# Patient Record
Sex: Female | Born: 1980 | Race: White | Hispanic: No | Marital: Married | Smoking: Never smoker
Health system: Southern US, Community
[De-identification: ages and names within clinical notes are randomized; demographics above are authoritative.]

## PROBLEM LIST (undated history)

## (undated) DIAGNOSIS — E669 Obesity, unspecified: Secondary | ICD-10-CM

## (undated) DIAGNOSIS — K219 Gastro-esophageal reflux disease without esophagitis: Secondary | ICD-10-CM

## (undated) DIAGNOSIS — R42 Dizziness and giddiness: Secondary | ICD-10-CM

---

## 2004-04-18 ENCOUNTER — Emergency Department: Payer: Self-pay | Admitting: Emergency Medicine

## 2004-05-13 ENCOUNTER — Emergency Department: Payer: Self-pay | Admitting: Emergency Medicine

## 2004-06-09 ENCOUNTER — Emergency Department: Payer: Self-pay | Admitting: Emergency Medicine

## 2005-02-19 ENCOUNTER — Emergency Department: Payer: Self-pay | Admitting: General Practice

## 2006-01-21 ENCOUNTER — Observation Stay: Payer: Self-pay | Admitting: Obstetrics & Gynecology

## 2006-02-20 ENCOUNTER — Observation Stay: Payer: Self-pay | Admitting: Obstetrics & Gynecology

## 2006-02-24 ENCOUNTER — Observation Stay: Payer: Self-pay

## 2006-03-11 ENCOUNTER — Observation Stay: Payer: Self-pay | Admitting: Obstetrics & Gynecology

## 2006-03-14 ENCOUNTER — Observation Stay: Payer: Self-pay

## 2006-03-20 ENCOUNTER — Inpatient Hospital Stay: Payer: Self-pay

## 2006-03-20 ENCOUNTER — Observation Stay: Payer: Self-pay

## 2009-08-31 ENCOUNTER — Emergency Department: Payer: Self-pay | Admitting: Emergency Medicine

## 2009-09-18 ENCOUNTER — Emergency Department: Payer: Self-pay | Admitting: Emergency Medicine

## 2010-01-16 ENCOUNTER — Emergency Department: Payer: Self-pay | Admitting: Emergency Medicine

## 2010-10-13 ENCOUNTER — Emergency Department: Payer: Self-pay | Admitting: Emergency Medicine

## 2010-12-10 ENCOUNTER — Emergency Department (HOSPITAL_COMMUNITY)
Admission: EM | Admit: 2010-12-10 | Discharge: 2010-12-10 | Disposition: A | Discharge status: Home / Self Care | Payer: Self-pay | Attending: Emergency Medicine | Admitting: Emergency Medicine

## 2010-12-10 DIAGNOSIS — R1013 Epigastric pain: Secondary | ICD-10-CM | POA: Insufficient documentation

## 2010-12-10 DIAGNOSIS — K219 Gastro-esophageal reflux disease without esophagitis: Secondary | ICD-10-CM | POA: Insufficient documentation

## 2010-12-10 LAB — URINALYSIS, ROUTINE W REFLEX MICROSCOPIC
Glucose, UA: NEGATIVE mg/dL
Hgb urine dipstick: NEGATIVE
Ketones, ur: 15 mg/dL — AB
Protein, ur: NEGATIVE mg/dL

## 2010-12-10 LAB — CBC
HCT: 37.2 % (ref 36.0–46.0)
Hemoglobin: 12.7 g/dL (ref 12.0–15.0)
MCH: 29.3 pg (ref 26.0–34.0)
MCHC: 34.1 g/dL (ref 30.0–36.0)

## 2010-12-10 LAB — DIFFERENTIAL
Lymphocytes Relative: 34 % (ref 12–46)
Monocytes Absolute: 1.2 10*3/uL — ABNORMAL HIGH (ref 0.1–1.0)
Monocytes Relative: 12 % (ref 3–12)
Neutro Abs: 5.1 10*3/uL (ref 1.7–7.7)

## 2010-12-10 LAB — COMPREHENSIVE METABOLIC PANEL
AST: 30 U/L (ref 0–37)
Albumin: 3.9 g/dL (ref 3.5–5.2)
Chloride: 106 mEq/L (ref 96–112)
Creatinine, Ser: 0.63 mg/dL (ref 0.50–1.10)
Potassium: 3.4 mEq/L — ABNORMAL LOW (ref 3.5–5.1)
Total Bilirubin: 0.4 mg/dL (ref 0.3–1.2)
Total Protein: 7.6 g/dL (ref 6.0–8.3)

## 2010-12-10 LAB — URINE MICROSCOPIC-ADD ON

## 2010-12-11 LAB — URINE CULTURE
Colony Count: 50000
Culture  Setup Time: 201209051101

## 2011-04-05 ENCOUNTER — Emergency Department (HOSPITAL_COMMUNITY)
Admission: EM | Admit: 2011-04-05 | Discharge: 2011-04-05 | Disposition: A | Discharge status: Home / Self Care | Payer: Self-pay | Attending: Emergency Medicine | Admitting: Emergency Medicine

## 2011-04-05 DIAGNOSIS — K219 Gastro-esophageal reflux disease without esophagitis: Secondary | ICD-10-CM | POA: Insufficient documentation

## 2011-04-05 DIAGNOSIS — R1110 Vomiting, unspecified: Secondary | ICD-10-CM

## 2011-04-05 DIAGNOSIS — R112 Nausea with vomiting, unspecified: Secondary | ICD-10-CM | POA: Insufficient documentation

## 2011-04-05 HISTORY — DX: Dizziness and giddiness: R42

## 2011-04-05 HISTORY — DX: Gastro-esophageal reflux disease without esophagitis: K21.9

## 2011-04-05 LAB — URINALYSIS, ROUTINE W REFLEX MICROSCOPIC
Bilirubin Urine: NEGATIVE
Hgb urine dipstick: NEGATIVE
Ketones, ur: NEGATIVE mg/dL
Protein, ur: NEGATIVE mg/dL
Urobilinogen, UA: 0.2 mg/dL (ref 0.0–1.0)

## 2011-04-05 LAB — URINE MICROSCOPIC-ADD ON

## 2011-04-05 MED ORDER — ONDANSETRON 4 MG PO TBDP
8.0000 mg | ORAL_TABLET | Freq: Once | ORAL | Status: AC
  Administered 2011-04-05: 8 mg via ORAL
  Filled 2011-04-05: qty 2

## 2011-04-05 MED ORDER — ONDANSETRON 8 MG PO TBDP: 8.0000 mg | ORAL_TABLET | Freq: Three times a day (TID) | ORAL | Status: AC | PRN

## 2011-04-05 NOTE — ED Provider Notes (Signed)
History     CSN: [id number]  Arrival date & time 04/05/11  1703   First MD Initiated Contact with Patient 04/05/11 2001      Chief Complaint  Patient presents with  . Nausea  . Emesis    (Consider location/radiation/quality/duration/timing/severity/associated sxs/prior treatment) HPI Comments: Patient with seven-day history of multiple episodes of vomiting per day. She says that the vomiting is worse when she eats. She denies any abdominal pain, fever, change in bowel habits. She denies any urinary symptoms. She has not treated herself with any medications. She has not had a history of this. Patient has a history of gastric reflux. She denies any change in these symptoms.  Patient is a 30 y.o. female presenting with vomiting. The history is provided by the patient.  Emesis  This is a new problem. The current episode started more than 1 week ago. The problem occurs 2 to 4 times per day. The emesis has an appearance of stomach contents. There has been no fever. Pertinent negatives include no abdominal pain, no arthralgias, no chills, no cough, no diarrhea, no fever, no headaches, no myalgias and no URI.    Past Medical History  Diagnosis Date  . Vertigo   . Acid reflux     History reviewed. No pertinent past surgical history.  No family history on file.  History  Substance Use Topics  . Smoking status: Never Smoker   . Smokeless tobacco: Not on file  . Alcohol Use: Yes     occasional    OB History    Grav Para Term Preterm Abortions TAB SAB Ect Mult Living                  Review of Systems  Constitutional: Negative for fever and chills.  HENT: Negative for sore throat and rhinorrhea.   Eyes: Negative for discharge.  Respiratory: Negative for cough and shortness of breath.   Cardiovascular: Negative for chest pain.  Gastrointestinal: Positive for nausea and vomiting. Negative for abdominal pain, diarrhea and constipation.  Genitourinary: Negative for dysuria and  hematuria.  Musculoskeletal: Negative for myalgias and arthralgias.  Skin: Negative for rash.  Neurological: Negative for headaches.  Psychiatric/Behavioral: Negative for confusion.    Allergies  Review of patient's allergies indicates no known allergies.  Home Medications  No current outpatient prescriptions on file.  BP 132/72  Pulse 84  Temp(Src) 98.1 F (36.7 C) (Oral)  Resp 18  SpO2 100%  LMP 03/15/2011  Physical Exam  Nursing note and vitals reviewed. Constitutional: She is oriented to person, place, and time. She appears well-developed and well-nourished.       Obese  HENT:  Head: Normocephalic and atraumatic.  Eyes: Right eye exhibits no discharge. Left eye exhibits no discharge.  Neck: Normal range of motion. Neck supple.  Cardiovascular: Normal rate and regular rhythm.  Exam reveals no gallop and no friction rub.   No murmur heard. Pulmonary/Chest: Effort normal and breath sounds normal. No respiratory distress. She has no wheezes.  Abdominal: Soft. Bowel sounds are normal. She exhibits no distension. There is no tenderness. There is no rebound and no guarding.  Musculoskeletal: Normal range of motion.  Neurological: She is alert and oriented to person, place, and time.  Skin: Skin is warm and dry. No rash noted.  Psychiatric: She has a normal mood and affect.    ED Course  Procedures (including critical care time)  Labs Reviewed  URINALYSIS, ROUTINE W REFLEX MICROSCOPIC - Abnormal; Notable for the  following:    APPearance CLOUDY (*)    Leukocytes, UA MODERATE (*)    All other components within normal limits  URINE MICROSCOPIC-ADD ON - Abnormal; Notable for the following:    Squamous Epithelial / LPF MANY (*)    Bacteria, UA MANY (*)    All other components within normal limits  POCT PREGNANCY, URINE  URINE CULTURE   No results found.   1. Vomiting     8:42 PM patient seen and examined. Will give Zofran and check urine tests. Will give fluid  trial.  8:57 PM patient informed of results. Urine culture sent. She is feeling better after Zofran. Will discharge to home. Patient urged to return with worsening pain, fever, persistent vomiting, or if she has any other concerns. GI referral given. Patient urged to followup if no improvement in one week. She verbalizes understanding and agrees with plan.  MDM  Patient with vomiting without fever or abdominal pain. Urine tests and pregnancy test are negative. Do not feel lab work would be helpful at this point given lack of other symptoms. Patient is improved with Zofran. She is not clinically dehydrated. She otherwise appears well and is stable for discharge home.    Medical screening examination/treatment/procedure(s) were performed by non-physician practitioner and as supervising physician I was immediately available for consultation/collaboration. Osvaldo Human, M.D.   Eustace Moore [place], [place] 04/06/11 1610  Carleene Cooper III, MD 04/06/11 [phone]

## 2011-04-05 NOTE — ED Notes (Signed)
Pt states she is sexually active, but is not using any birth control.

## 2011-04-05 NOTE — ED Notes (Signed)
Pt reports that on 12/24 she began having some nausea and she vomited once.  Since then she is vomiting 2 or 3 times per day.

## 2011-04-05 NOTE — ED Notes (Signed)
Patient given ginger ale, per physician's orders.  Patient tolerating well.  Denies nausea at this time.

## 2011-04-05 NOTE — ED Notes (Signed)
PA at bedside.

## 2011-04-05 NOTE — ED Notes (Signed)
Assisted patient to restroom to collect urine specimen.

## 2011-04-05 NOTE — ED Notes (Addendum)
Patient complaining of headache, sleepiness, nausea and vomiting since the 24th of December; patient states that she is able to eat and drink, but vomits an hour or two after ingesting.  Patient denies recent cold symptoms, chest pain, abdominal pain, shortness of breath, blurred vision, and numbness/tingling in hands/feet.  Denies any urinary changes, reports thick white discharge on two occassions this week.  Upon assessment, bowel sounds are present in all four quadrants; abdomen is soft and non-tender to the touch.  Patient alert and oriented x4; PERRL present.  Will continue to monitor.

## 2011-04-06 LAB — URINE CULTURE: Culture  Setup Time: 201212310138

## 2011-06-02 ENCOUNTER — Emergency Department (HOSPITAL_COMMUNITY): Payer: Medicaid Other

## 2011-06-02 ENCOUNTER — Encounter (HOSPITAL_COMMUNITY): Payer: Self-pay | Admitting: *Deleted

## 2011-06-02 ENCOUNTER — Emergency Department (HOSPITAL_COMMUNITY)
Admission: EM | Admit: 2011-06-02 | Discharge: 2011-06-02 | Disposition: A | Discharge status: Home / Self Care | Payer: Medicaid Other | Attending: Emergency Medicine | Admitting: Emergency Medicine

## 2011-06-02 DIAGNOSIS — R1012 Left upper quadrant pain: Secondary | ICD-10-CM | POA: Insufficient documentation

## 2011-06-02 DIAGNOSIS — K219 Gastro-esophageal reflux disease without esophagitis: Secondary | ICD-10-CM | POA: Insufficient documentation

## 2011-06-02 DIAGNOSIS — N390 Urinary tract infection, site not specified: Secondary | ICD-10-CM | POA: Insufficient documentation

## 2011-06-02 LAB — URINALYSIS, ROUTINE W REFLEX MICROSCOPIC
Bilirubin Urine: NEGATIVE
Glucose, UA: NEGATIVE mg/dL
Hgb urine dipstick: NEGATIVE
Specific Gravity, Urine: 1.013 (ref 1.005–1.030)
Urobilinogen, UA: 0.2 mg/dL (ref 0.0–1.0)
pH: 7 (ref 5.0–8.0)

## 2011-06-02 LAB — CBC
HCT: 38 % (ref 36.0–46.0)
Hemoglobin: 12.7 g/dL (ref 12.0–15.0)
MCHC: 33.4 g/dL (ref 30.0–36.0)
MCV: 88 fL (ref 78.0–100.0)
RDW: 13.5 % (ref 11.5–15.5)

## 2011-06-02 LAB — COMPREHENSIVE METABOLIC PANEL
Albumin: 3.5 g/dL (ref 3.5–5.2)
Alkaline Phosphatase: 64 U/L (ref 39–117)
BUN: 7 mg/dL (ref 6–23)
Chloride: 105 mEq/L (ref 96–112)
Creatinine, Ser: 0.55 mg/dL (ref 0.50–1.10)
GFR calc Af Amer: >90 mL/min (ref >90)
Glucose, Bld: 93 mg/dL (ref 70–99)
Potassium: 3.9 mEq/L (ref 3.5–5.1)
Total Bilirubin: 0.5 mg/dL (ref 0.3–1.2)
Total Protein: 7.2 g/dL (ref 6.0–8.3)

## 2011-06-02 LAB — DIFFERENTIAL
Basophils Absolute: 0 10*3/uL (ref 0.0–0.1)
Basophils Relative: 0 % (ref 0–1)
Eosinophils Relative: 1 % (ref 0–5)
Monocytes Absolute: 0.8 10*3/uL (ref 0.1–1.0)
Monocytes Relative: 10 % (ref 3–12)

## 2011-06-02 LAB — URINE MICROSCOPIC-ADD ON

## 2011-06-02 LAB — POCT PREGNANCY, URINE: Preg Test, Ur: POSITIVE — AB

## 2011-06-02 MED ORDER — CEPHALEXIN 500 MG PO CAPS: 500.0000 mg | ORAL_CAPSULE | Freq: Four times a day (QID) | ORAL | Status: AC

## 2011-06-02 MED ORDER — FAMOTIDINE 20 MG PO TABS
40.0000 mg | ORAL_TABLET | Freq: Once | ORAL | Status: AC
  Administered 2011-06-02: 40 mg via ORAL

## 2011-06-02 MED ORDER — ONDANSETRON HCL 4 MG PO TABS: 4.0000 mg | ORAL_TABLET | Freq: Four times a day (QID) | ORAL | Status: AC | PRN

## 2011-06-02 MED ORDER — FAMOTIDINE 20 MG PO TABS
ORAL_TABLET | ORAL | Status: AC
  Filled 2011-06-02: qty 2

## 2011-06-02 MED ORDER — SODIUM CHLORIDE 0.9 % IV SOLN: INTRAVENOUS | Status: DC

## 2011-06-02 NOTE — ED Notes (Signed)
Patient complains of left sided upper abd pain and tightness in her abd.  She also reports dizziness when she stands up.  She is also reporting nausea.  She is [redacted] weeks pregnant.

## 2011-06-02 NOTE — ED Provider Notes (Deleted)
30yo F, c/o gradual onset and worsening of persistent upper abd pain for the past week, constant since yesterday.  Describes the pain as "tightness," occurs with eating, assoc with nausea. ?hx gallstones.  LMP 04/13/2011, EGA approx 7-1/7 weeks.  Denies lower abd pain, no vaginal bleeding/discharge.  Labs, [place], UA/UC, orthostatic VS, IVF ordered.   I saw this patient for screening purposes.  Pt is stable, but further workup and evaluation is needed beyond triage capabilities.   Laray Anger, DO 06/02/11 1210

## 2011-06-02 NOTE — ED Provider Notes (Signed)
History     CSN: [id number]  Arrival date & time 06/02/11  1040   First MD Initiated Contact with Patient 06/02/11 1332      Chief Complaint  Patient presents with  . Abdominal Pain    HPI Pt was seen at 1155.  Per pt, c/o gradual onset and worsening of persistent left and mid-upper abd pain for the past week, constant since yesterday.  Describes the pain as "tightness," occurs with eating, assoc with nausea. ?hx gallstones.  LMP 04/13/2011, EGA approx 7-1/7 weeks.  Denies lower abd pain, no vaginal bleeding/discharge, no back pain, no vomiting/diarrhea, no fevers, no CP/SOB.   Past Medical History  Diagnosis Date  . Vertigo   . Acid reflux     History reviewed. No pertinent past surgical history.   History  Substance Use Topics  . Smoking status: Never Smoker   . Smokeless tobacco: Not on file  . Alcohol Use: No     occasional    Review of Systems ROS: Statement: All systems negative except as marked or noted in the HPI; Constitutional: Negative for fever and chills. ; ; Eyes: Negative for eye pain, redness and discharge. ; ; ENMT: Negative for ear pain, hoarseness, nasal congestion, sinus pressure and sore throat. ; ; Cardiovascular: Negative for chest pain, palpitations, diaphoresis, dyspnea and peripheral edema. ; ; Respiratory: Negative for cough, wheezing and stridor. ; ; Gastrointestinal: +nausea, abd pain.  Negative for vomiting, diarrhea, blood in stool, hematemesis, jaundice and rectal bleeding. . ; ; Genitourinary: Negative for dysuria, flank pain and hematuria. ; ; Musculoskeletal: Negative for back pain and neck pain. Negative for swelling and trauma.; ; Skin: Negative for pruritus, rash, abrasions, blisters, bruising and skin lesion.; ; Neuro: Negative for headache, lightheadedness and neck stiffness. Negative for weakness, altered level of consciousness , altered mental status, extremity weakness, paresthesias, involuntary movement, seizure and syncope.      Allergies  Review of patient's allergies indicates no known allergies.  Home Medications   Current Outpatient Rx  Name Route Sig Dispense Refill  . PRENATAL MULTIVITAMIN CH Oral Take 1 tablet by mouth daily.      BP 117/74  Pulse 80  Temp 98 F (36.7 C)  Resp 18  Ht 5\' 5"  (1.651 m)  Wt 312 lb (141.522 kg)  BMI 51.92 kg/m2  SpO2 98%  Physical Exam 1200: Physical examination:  Nursing notes reviewed; Vital signs and O2 SAT reviewed;  Constitutional: Well developed, Well nourished, Well hydrated, In no acute distress; Head:  Normocephalic, atraumatic; Eyes: EOMI, PERRL, No scleral icterus; ENMT: Mouth and pharynx normal, Mucous membranes moist; Neck: Supple, Full range of motion, No lymphadenopathy; Cardiovascular: Regular rate and rhythm, No murmur, rub, or gallop; Respiratory: Breath sounds clear & equal bilaterally, No rales, rhonchi, wheezes, or rub, Normal respiratory effort/excursion; Chest: Nontender, Movement normal; Abdomen: Soft, +mild LUQ and mid-epigastric TTP, no rebound or guarding.  Nondistended, Normal bowel sounds; Genitourinary: No CVA tenderness; Extremities: Pulses normal, No tenderness, No edema, No calf edema or asymmetry.; Neuro: AA&Ox3, Major CN grossly intact.  No gross focal motor or sensory deficits in extremities.; Skin: Color normal, Warm, Dry.    ED Course  Procedures    MDM  MDM Reviewed: nursing note and vitals Interpretation: labs and ultrasound   Results for orders placed during the hospital encounter of 06/02/11  CBC      Component Value Range   WBC 8.2  4.0 - 10.5 (K/uL)   RBC 4.32  3.87 -  5.11 (MIL/uL)   Hemoglobin 12.7  12.0 - 15.0 (g/dL)   HCT 16.1  09.6 - 04.5 (%)   MCV 88.0  78.0 - 100.0 (fL)   MCH 29.4  26.0 - 34.0 (pg)   MCHC 33.4  30.0 - 36.0 (g/dL)   RDW 40.9  81.1 - 91.4 (%)   Platelets 197  150 - 400 (K/uL)  DIFFERENTIAL      Component Value Range   Neutrophils Relative 58  43 - 77 (%)   Neutro Abs 4.8  1.7 - 7.7  (K/uL)   Lymphocytes Relative 30  12 - 46 (%)   Lymphs Abs 2.5  0.7 - 4.0 (K/uL)   Monocytes Relative 10  3 - 12 (%)   Monocytes Absolute 0.8  0.1 - 1.0 (K/uL)   Eosinophils Relative 1  0 - 5 (%)   Eosinophils Absolute 0.1  0.0 - 0.7 (K/uL)   Basophils Relative 0  0 - 1 (%)   Basophils Absolute 0.0  0.0 - 0.1 (K/uL)  LIPASE, BLOOD      Component Value Range   Lipase 14  11 - 59 (U/L)  COMPREHENSIVE METABOLIC PANEL      Component Value Range   Sodium 137  135 - 145 (mEq/L)   Potassium 3.9  3.5 - 5.1 (mEq/L)   Chloride 105  96 - 112 (mEq/L)   CO2 22  19 - 32 (mEq/L)   Glucose, Bld 93  70 - 99 (mg/dL)   BUN 7  6 - 23 (mg/dL)   Creatinine, Ser 7.82  0.50 - 1.10 (mg/dL)   Calcium 9.4  8.4 - 95.6 (mg/dL)   Total Protein 7.2  6.0 - 8.3 (g/dL)   Albumin 3.5  3.5 - 5.2 (g/dL)   AST 18  0 - 37 (U/L)   ALT 15  0 - 35 (U/L)   Alkaline Phosphatase 64  39 - 117 (U/L)   Total Bilirubin 0.5  0.3 - 1.2 (mg/dL)   GFR calc non Af Amer >90  >90 (mL/min)   GFR calc Af Amer >90  >90 (mL/min)  URINALYSIS, ROUTINE W REFLEX MICROSCOPIC      Component Value Range   Color, Urine YELLOW  YELLOW    APPearance CLOUDY (*) CLEAR    Specific Gravity, Urine 1.013  1.005 - 1.030    pH 7.0  5.0 - 8.0    Glucose, UA NEGATIVE  NEGATIVE (mg/dL)   Hgb urine dipstick NEGATIVE  NEGATIVE    Bilirubin Urine NEGATIVE  NEGATIVE    Ketones, ur NEGATIVE  NEGATIVE (mg/dL)   Protein, ur NEGATIVE  NEGATIVE (mg/dL)   Urobilinogen, UA 0.2  0.0 - 1.0 (mg/dL)   Nitrite NEGATIVE  NEGATIVE    Leukocytes, UA MODERATE (*) NEGATIVE   POCT PREGNANCY, URINE      Component Value Range   Preg Test, Ur POSITIVE (*) NEGATIVE   URINE MICROSCOPIC-ADD ON      Component Value Range   Squamous Epithelial / LPF FEW (*) RARE    WBC, UA 7-10  <3 (WBC/hpf)   RBC / HPF 0-2  <3 (RBC/hpf)   Bacteria, UA RARE  RARE     US Abdomen Complete 06/02/2011  *RADIOLOGY REPORT*  Clinical Data:  Upper abdominal pain.  ABDOMINAL ULTRASOUND COMPLETE   Comparison:  None.  Findings:  Gallbladder:  No gallstones, gallbladder wall thickening, or pericholecystic fluid.  Common Bile Duct:  Within normal limits in caliber. Measures 3 mm in diameter.  Liver: No  focal mass lesion identified.  Within normal limits in parenchymal echogenicity.  IVC:  Appears normal.  Pancreas:  No abnormality identified.  Spleen:  Within normal limits in size and echotexture.  Right kidney:  Normal in size and parenchymal echogenicity.  No evidence of mass or hydronephrosis.  Left kidney:  Normal in size and parenchymal echogenicity.  No evidence of mass or hydronephrosis.  Abdominal Aorta:  No aneurysm identified.  IMPRESSION: Negative abdominal ultrasound.  Original Report Authenticated By: Danae Orleans, M.D.    2:01 PM:  Pt not orthostatic, VS remain stable.  No N/V while in ED.  [place] abd neg for acute GB, LFT's and lipase normal.  +UTI, UC pending, will tx for same.  Denies lower abd/pelvic pain, cramping, vaginal discharge or bleeding.  Endorses she has an appt next week with OB/GYN for this pregnancy.  Dx testing d/w pt and family.  Questions answered.  Verb understanding, agreeable to d/c home with outpt f/u.           Laray Anger, DO 06/04/11 [phone]

## 2011-06-03 LAB — URINE CULTURE

## 2011-06-15 ENCOUNTER — Other Ambulatory Visit: Payer: Self-pay

## 2011-06-15 ENCOUNTER — Other Ambulatory Visit (HOSPITAL_COMMUNITY): Payer: Self-pay | Admitting: Nurse Practitioner

## 2011-06-15 DIAGNOSIS — Z3682 Encounter for antenatal screening for nuchal translucency: Secondary | ICD-10-CM

## 2011-07-14 ENCOUNTER — Encounter (HOSPITAL_COMMUNITY): Payer: Self-pay

## 2011-07-14 ENCOUNTER — Ambulatory Visit (HOSPITAL_COMMUNITY)
Admission: RE | Admit: 2011-07-14 | Discharge: 2011-07-14 | Disposition: A | Discharge status: Home / Self Care | Payer: Medicaid Other | Source: Ambulatory Visit | Attending: Nurse Practitioner | Admitting: Nurse Practitioner

## 2011-07-14 ENCOUNTER — Ambulatory Visit (HOSPITAL_COMMUNITY): Payer: Medicaid Other

## 2011-07-14 DIAGNOSIS — O351XX0 Maternal care for (suspected) chromosomal abnormality in fetus, not applicable or unspecified: Secondary | ICD-10-CM | POA: Insufficient documentation

## 2011-07-14 DIAGNOSIS — O3510X0 Maternal care for (suspected) chromosomal abnormality in fetus, unspecified, not applicable or unspecified: Secondary | ICD-10-CM | POA: Insufficient documentation

## 2011-07-14 DIAGNOSIS — O98519 Other viral diseases complicating pregnancy, unspecified trimester: Secondary | ICD-10-CM | POA: Insufficient documentation

## 2011-07-14 DIAGNOSIS — O352XX0 Maternal care for (suspected) hereditary disease in fetus, not applicable or unspecified: Secondary | ICD-10-CM | POA: Insufficient documentation

## 2011-07-14 DIAGNOSIS — O3410 Maternal care for benign tumor of corpus uteri, unspecified trimester: Secondary | ICD-10-CM | POA: Insufficient documentation

## 2011-07-14 DIAGNOSIS — Z3689 Encounter for other specified antenatal screening: Secondary | ICD-10-CM | POA: Insufficient documentation

## 2011-07-14 DIAGNOSIS — O337XX0 Maternal care for disproportion due to other fetal deformities, not applicable or unspecified: Secondary | ICD-10-CM | POA: Insufficient documentation

## 2011-07-14 DIAGNOSIS — O355XX0 Maternal care for (suspected) damage to fetus by drugs, not applicable or unspecified: Secondary | ICD-10-CM | POA: Insufficient documentation

## 2011-07-14 DIAGNOSIS — Z3682 Encounter for antenatal screening for nuchal translucency: Secondary | ICD-10-CM

## 2011-07-14 DIAGNOSIS — E669 Obesity, unspecified: Secondary | ICD-10-CM | POA: Insufficient documentation

## 2011-07-24 ENCOUNTER — Other Ambulatory Visit: Payer: Self-pay | Admitting: Maternal and Fetal Medicine

## 2011-08-10 ENCOUNTER — Other Ambulatory Visit (HOSPITAL_COMMUNITY): Payer: Self-pay | Admitting: Physician Assistant

## 2011-08-10 DIAGNOSIS — Z3689 Encounter for other specified antenatal screening: Secondary | ICD-10-CM

## 2011-08-24 ENCOUNTER — Ambulatory Visit (HOSPITAL_COMMUNITY)
Admission: RE | Admit: 2011-08-24 | Discharge: 2011-08-24 | Disposition: A | Discharge status: Home / Self Care | Payer: Medicaid Other | Source: Ambulatory Visit | Attending: Physician Assistant | Admitting: Physician Assistant

## 2011-08-24 DIAGNOSIS — Z3689 Encounter for other specified antenatal screening: Secondary | ICD-10-CM | POA: Insufficient documentation

## 2011-09-07 ENCOUNTER — Other Ambulatory Visit (HOSPITAL_COMMUNITY): Payer: Self-pay | Admitting: Family

## 2011-09-07 DIAGNOSIS — Z361 Encounter for antenatal screening for raised alphafetoprotein level: Secondary | ICD-10-CM

## 2011-09-16 ENCOUNTER — Ambulatory Visit (HOSPITAL_COMMUNITY)
Admission: RE | Admit: 2011-09-16 | Discharge: 2011-09-16 | Disposition: A | Discharge status: Home / Self Care | Payer: Medicaid Other | Source: Ambulatory Visit | Attending: Family | Admitting: Family

## 2011-09-16 DIAGNOSIS — O337XX0 Maternal care for disproportion due to other fetal deformities, not applicable or unspecified: Secondary | ICD-10-CM | POA: Insufficient documentation

## 2011-09-16 DIAGNOSIS — O98519 Other viral diseases complicating pregnancy, unspecified trimester: Secondary | ICD-10-CM | POA: Insufficient documentation

## 2011-09-16 DIAGNOSIS — A6000 Herpesviral infection of urogenital system, unspecified: Secondary | ICD-10-CM | POA: Insufficient documentation

## 2011-09-16 DIAGNOSIS — Z361 Encounter for antenatal screening for raised alphafetoprotein level: Secondary | ICD-10-CM

## 2011-09-16 DIAGNOSIS — O355XX0 Maternal care for (suspected) damage to fetus by drugs, not applicable or unspecified: Secondary | ICD-10-CM | POA: Insufficient documentation

## 2011-09-16 DIAGNOSIS — E669 Obesity, unspecified: Secondary | ICD-10-CM | POA: Insufficient documentation

## 2011-09-16 DIAGNOSIS — O352XX0 Maternal care for (suspected) hereditary disease in fetus, not applicable or unspecified: Secondary | ICD-10-CM | POA: Insufficient documentation

## 2012-07-16 IMAGING — US US OB DETAIL+14 WK
1 of 2 series · 12 of 28 positions shown · non-contrast
Comparison: none

[Series 1: us ob detail +14 wk · 12 of 49 slices shown]
[im 1/49]
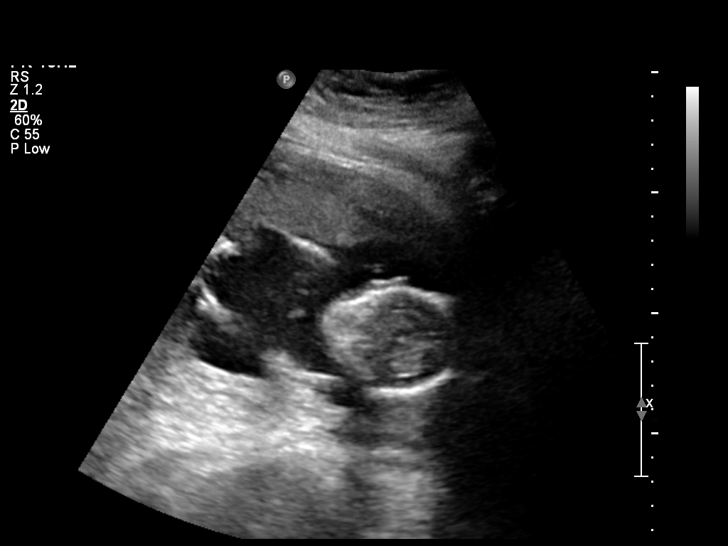
[im 4/49]
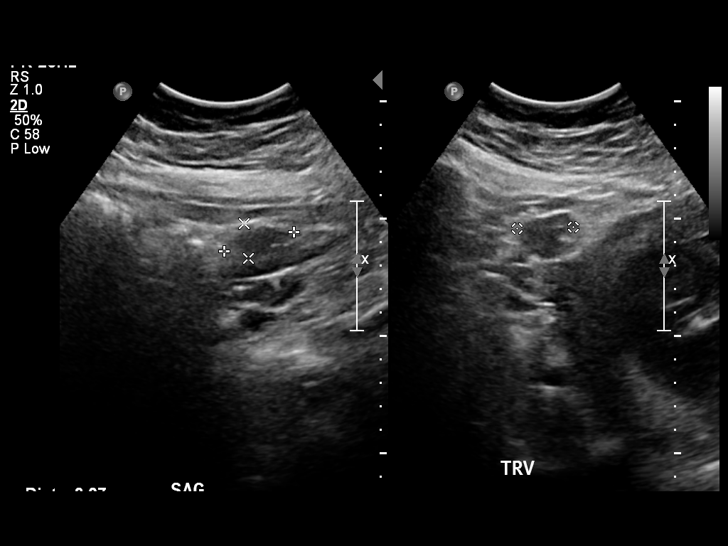
[im 8/49]
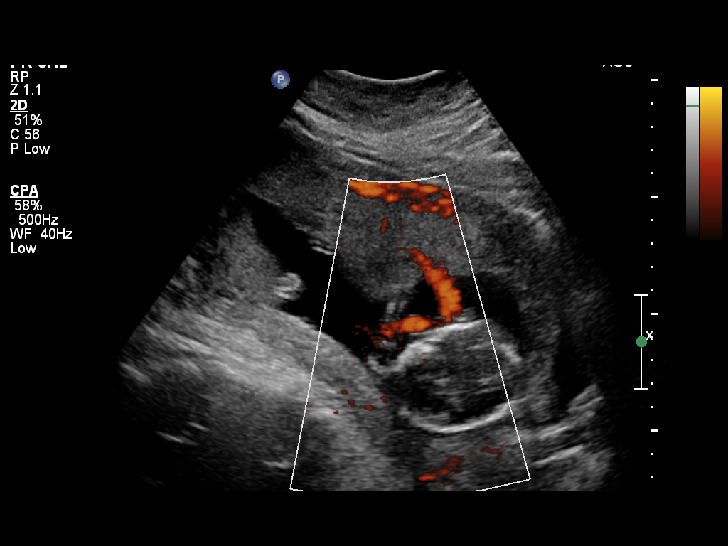
[im 13/49]
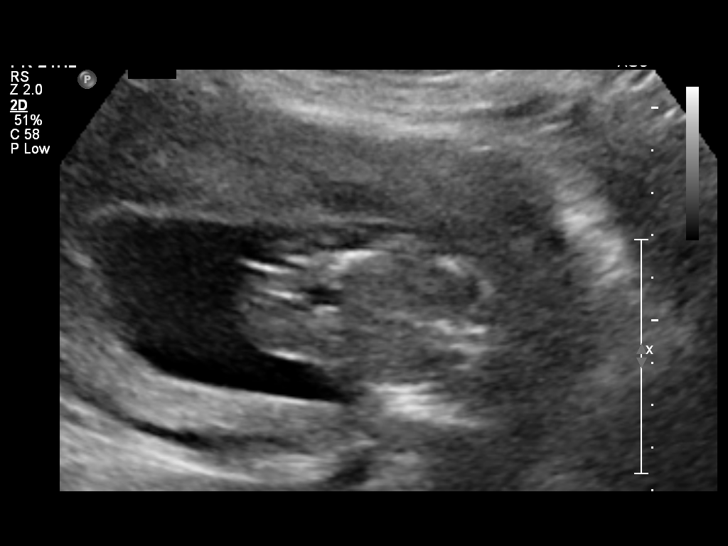
[im 17/49]
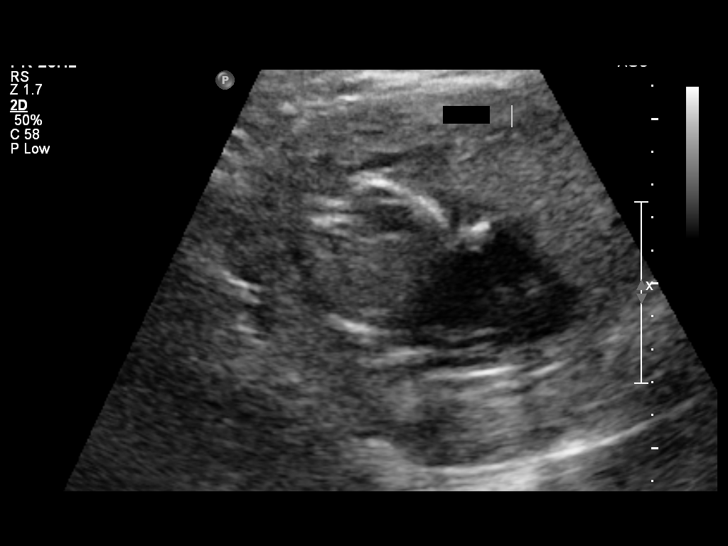
[im 21/49]
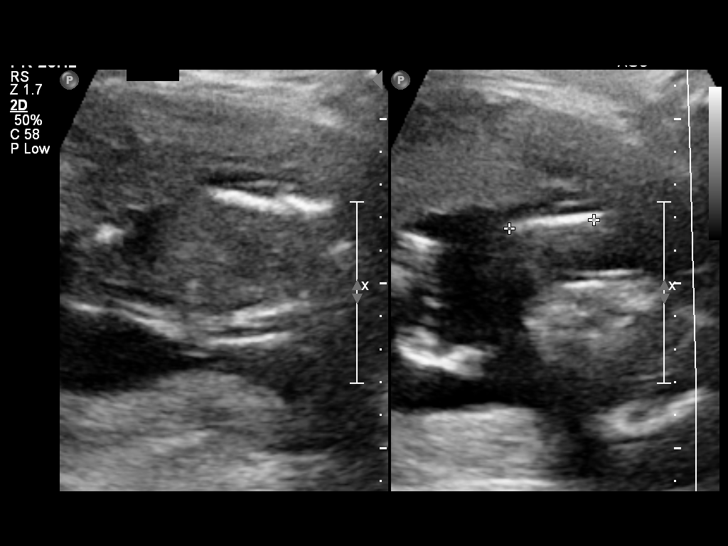
[im 26/49]
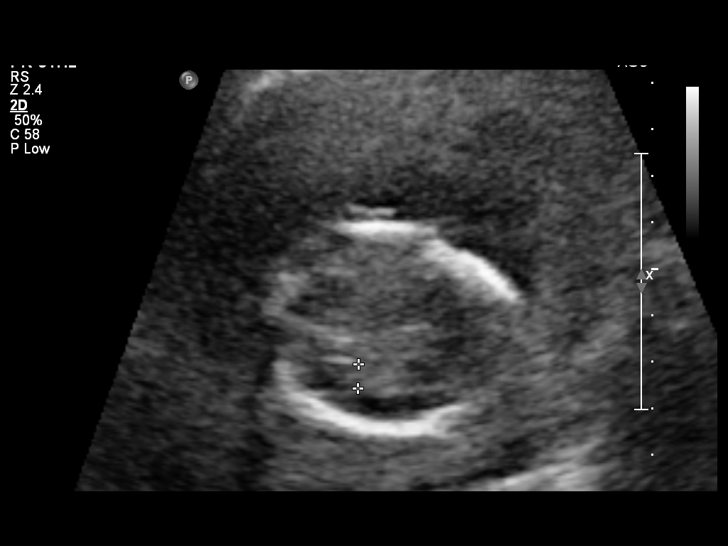
[im 30/49]
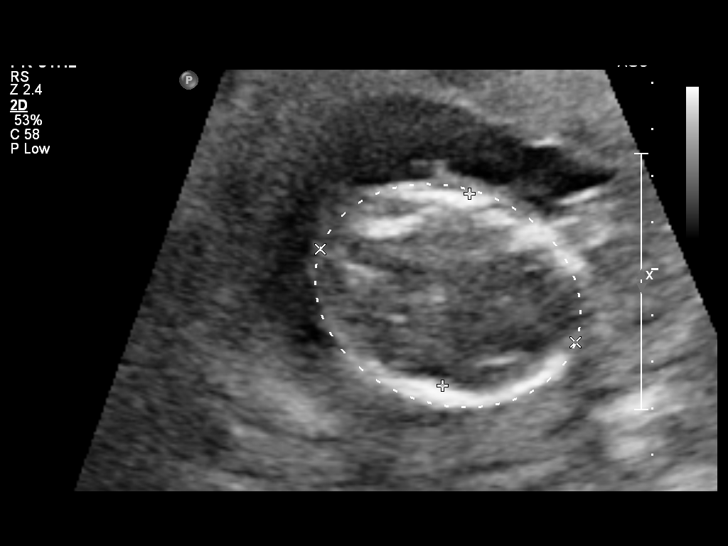
[im 34/49]
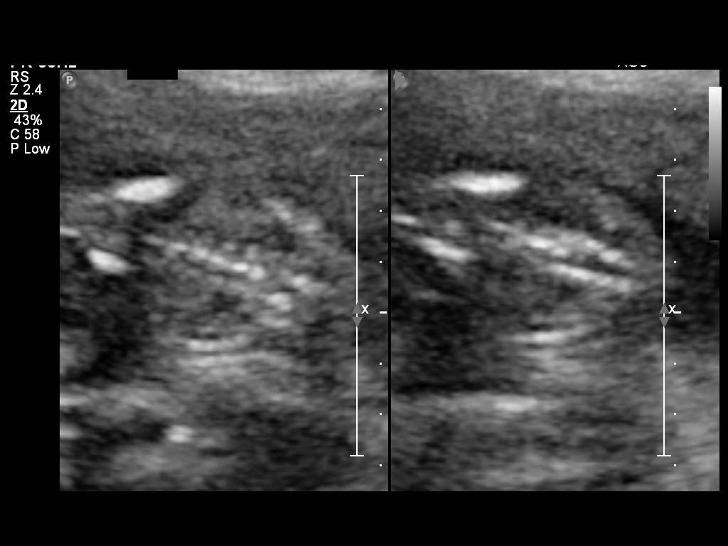
[im 39/49]
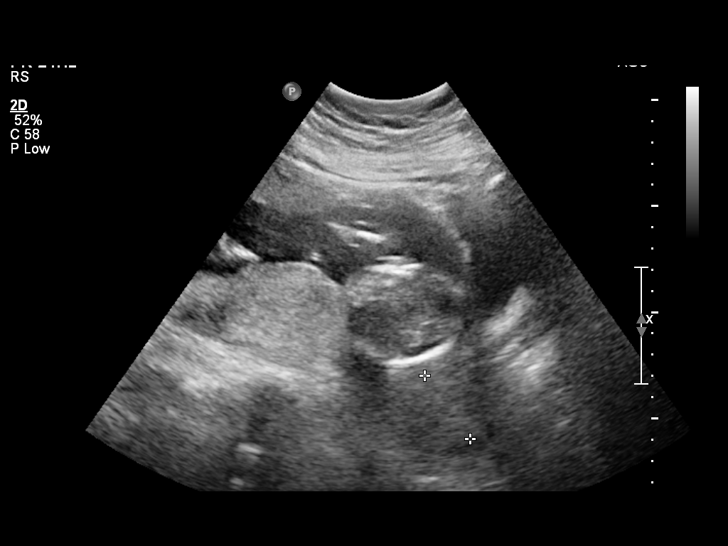
[im 43/49]
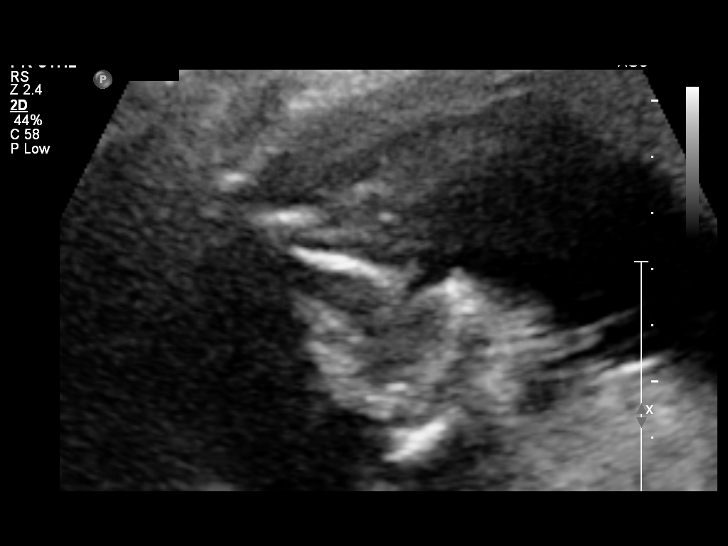
[im 47/49]
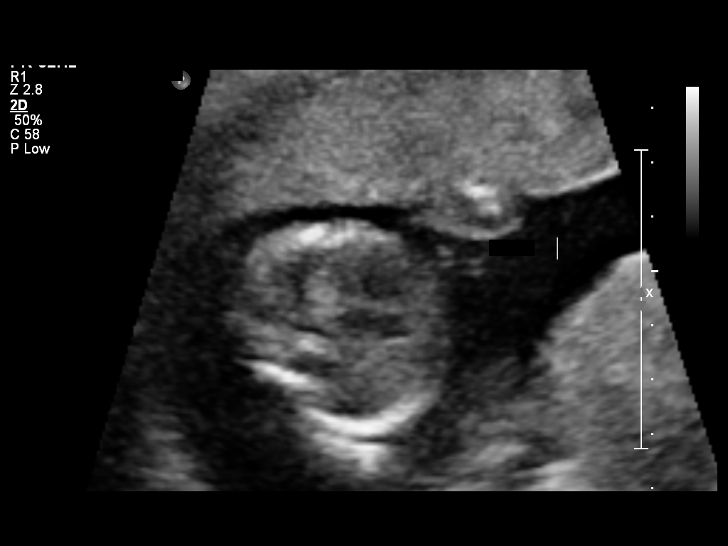

[12 of 28 positions shown; findings below may reference images not displayed]

OBSTETRICS REPORT
                      (Signed Final 08/24/2011 [DATE])

                 PA
 Order#:         [id number]
Procedures

 US OB DETAIL + 14 WK                                  76811.0
Indications

 Detailed fetal anatomic survey
 Obesity
 Maternal drug exposure
 Poor obstetric history: Previous macrosomia
 Herpes simplex virus (DERLYS SASLEY)
Fetal Evaluation

 Fetal Heart Rate:  150                         bpm
 Cardiac Activity:  Observed
 Presentation:      Variable
 Placenta:          Anterior, above cervical os

 Amniotic Fluid
 AFI FV:      Subjectively within normal limits
                                             Larg Pckt:   3.26   cm
Biometry

 BPD:     41.5  mm    G. Age:   18w 4d                CI:        65.26   70 - 86
                                                      FL/HC:      16.4   15.8 -
                                                                         18
 HC:       165  mm    G. Age:   19w 1d       91  %    HC/AC:      1.24   1.07 -

 AC:     133.4  mm    G. Age:   18w 6d       75  %    FL/BPD:
 FL:      27.1  mm    G. Age:   18w 2d       55  %    FL/AC:      20.3   20 - 24
 HUM:     25.7  mm    G. Age:   18w 0d       58  %
 Est. FW:     249  gm      0 lb 9 oz     59  %
Gestational Age

 LMP:           18w 6d       Date:   04/14/11                 EDD:   01/19/12
 U/S Today:     18w 5d                                        EDD:   01/20/12
 Best:          18w 0d    Det. By:   U/S C R L   (07/14/11)   EDD:   01/25/12
Anatomy
 Cranium:           Appears normal      Aortic Arch:       Not well
                                                           visualized
 Fetal Cavum:       Not well            Ductal Arch:       Not well
                    visualized                             visualized
 Ventricles:        Appears normal      Diaphragm:         Not well
                                                           visualized
 Choroid Plexus:    Not well            Stomach:           Appears normal
                    visualized
 Cerebellum:        Not well            Abdomen:           Appears normal
                    visualized
 Posterior Fossa:   Not well            Abdominal Wall:    Appears nml
                    visualized                             (cord insert,
                                                           abd wall)
 Nuchal Fold:       Not well            Cord Vessels:      Appears normal
                    visualized                             (3 vessel cord)
 Face:              Not well            Kidneys:           Not well
                    visualized                             visualized
 Heart:             Not well            Bladder:           Appears normal
                    visualized
 RVOT:              Not well            Spine:             Not well
                    visualized                             visualized
 LVOT:              Not well            Limbs:             Four extremities
                    visualized                             seen

 Other:     Technically difficult due to  maternal habitus.
Cervix Uterus Adnexa

 Cervical Length:   3.39      cm

 Cervix:       Normal appearance by transabdominal scan.
 Uterus:       No abnormality visualized.
 Cul De Sac:   No free fluid seen.
 Left Ovary:   Not visualized.
 Right Ovary:  Normal, measuring 3.1 x 1.5 x 2.4cm.
 Adnexa:     No abnormality visualized.
Impression

 Assigned GA is currently 18w 0d by early US. Appropriate
 interval fetal growth.
 Suboptimal visualization of fetal anatomy, however no fetal
 anomalies identified.
 Normal amniotic fluid volume. Normal cervical length.
Recommendations

 Consider follow-up ultrasound to complete fetal anatomic
 evaluation in 3-4 weeks.

 questions or concerns.

## 2014-02-05 ENCOUNTER — Encounter (HOSPITAL_COMMUNITY): Payer: Self-pay

## 2015-11-18 ENCOUNTER — Emergency Department (HOSPITAL_COMMUNITY): Payer: Managed Care, Other (non HMO)

## 2015-11-18 ENCOUNTER — Encounter (HOSPITAL_COMMUNITY): Payer: Self-pay | Admitting: *Deleted

## 2015-11-18 ENCOUNTER — Emergency Department (HOSPITAL_COMMUNITY)
Admission: EM | Admit: 2015-11-18 | Discharge: 2015-11-18 | Disposition: A | Discharge status: Home / Self Care | Payer: Managed Care, Other (non HMO) | Attending: Emergency Medicine | Admitting: Emergency Medicine

## 2015-11-18 DIAGNOSIS — M25521 Pain in right elbow: Secondary | ICD-10-CM | POA: Diagnosis not present

## 2015-11-18 DIAGNOSIS — Y999 Unspecified external cause status: Secondary | ICD-10-CM | POA: Insufficient documentation

## 2015-11-18 DIAGNOSIS — Y939 Activity, unspecified: Secondary | ICD-10-CM | POA: Insufficient documentation

## 2015-11-18 DIAGNOSIS — Y929 Unspecified place or not applicable: Secondary | ICD-10-CM | POA: Insufficient documentation

## 2015-11-18 DIAGNOSIS — M25511 Pain in right shoulder: Secondary | ICD-10-CM | POA: Insufficient documentation

## 2015-11-18 DIAGNOSIS — M545 Low back pain: Secondary | ICD-10-CM | POA: Diagnosis not present

## 2015-11-18 LAB — POC URINE PREG, ED: PREG TEST UR: NEGATIVE

## 2015-11-18 MED ORDER — NAPROXEN 250 MG PO TABS
500.0000 mg | ORAL_TABLET | Freq: Once | ORAL | Status: AC
  Administered 2015-11-18: 500 mg via ORAL
  Filled 2015-11-18: qty 2

## 2015-11-18 MED ORDER — NAPROXEN 500 MG PO TABS: 500.0000 mg | ORAL_TABLET | Freq: Two times a day (BID) | ORAL | 0 refills | Status: DC

## 2015-11-18 MED ORDER — ACETAMINOPHEN 325 MG PO TABS
650.0000 mg | ORAL_TABLET | Freq: Once | ORAL | Status: AC
  Administered 2015-11-18: 650 mg via ORAL
  Filled 2015-11-18: qty 2
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
## 2016-09-21 ENCOUNTER — Ambulatory Visit: Payer: Self-pay | Admitting: Emergency Medicine

## 2016-11-06 ENCOUNTER — Observation Stay (HOSPITAL_COMMUNITY)
Admission: EM | Admit: 2016-11-06 | Discharge: 2016-11-08 | Disposition: A | Discharge status: Home / Self Care | Payer: Managed Care, Other (non HMO) | Attending: General Surgery | Admitting: General Surgery

## 2016-11-06 ENCOUNTER — Encounter (HOSPITAL_COMMUNITY): Payer: Self-pay | Admitting: *Deleted

## 2016-11-06 DIAGNOSIS — R112 Nausea with vomiting, unspecified: Secondary | ICD-10-CM

## 2016-11-06 DIAGNOSIS — Z6841 Body Mass Index (BMI) 40.0 and over, adult: Secondary | ICD-10-CM | POA: Insufficient documentation

## 2016-11-06 DIAGNOSIS — E669 Obesity, unspecified: Secondary | ICD-10-CM | POA: Diagnosis not present

## 2016-11-06 DIAGNOSIS — R42 Dizziness and giddiness: Secondary | ICD-10-CM | POA: Insufficient documentation

## 2016-11-06 DIAGNOSIS — R197 Diarrhea, unspecified: Secondary | ICD-10-CM

## 2016-11-06 DIAGNOSIS — K5900 Constipation, unspecified: Secondary | ICD-10-CM | POA: Insufficient documentation

## 2016-11-06 DIAGNOSIS — K8062 Calculus of gallbladder and bile duct with acute cholecystitis without obstruction: Secondary | ICD-10-CM | POA: Diagnosis present

## 2016-11-06 DIAGNOSIS — F419 Anxiety disorder, unspecified: Secondary | ICD-10-CM | POA: Diagnosis not present

## 2016-11-06 DIAGNOSIS — K810 Acute cholecystitis: Secondary | ICD-10-CM | POA: Diagnosis not present

## 2016-11-06 DIAGNOSIS — K219 Gastro-esophageal reflux disease without esophagitis: Secondary | ICD-10-CM | POA: Diagnosis not present

## 2016-11-06 DIAGNOSIS — Z79899 Other long term (current) drug therapy: Secondary | ICD-10-CM | POA: Insufficient documentation

## 2016-11-06 DIAGNOSIS — K811 Chronic cholecystitis: Secondary | ICD-10-CM | POA: Insufficient documentation

## 2016-11-06 DIAGNOSIS — K8000 Calculus of gallbladder with acute cholecystitis without obstruction: Secondary | ICD-10-CM | POA: Diagnosis present

## 2016-11-06 HISTORY — DX: Obesity, unspecified: E66.9

## 2016-11-06 LAB — I-STAT TROPONIN, ED: Troponin i, poc: 0 ng/mL (ref 0.00–0.08)

## 2016-11-06 LAB — CBC
HCT: 40.1 % (ref 36.0–46.0)
HEMOGLOBIN: 13 g/dL (ref 12.0–15.0)
MCH: 28.7 pg (ref 26.0–34.0)
MCHC: 32.4 g/dL (ref 30.0–36.0)
MCV: 88.5 fL (ref 78.0–100.0)
PLATELETS: 228 10*3/uL (ref 150–400)
RBC: 4.53 MIL/uL (ref 3.87–5.11)
RDW: 13.7 % (ref 11.5–15.5)
WBC: 13 10*3/uL — ABNORMAL HIGH (ref 4.0–10.5)

## 2016-11-06 LAB — COMPREHENSIVE METABOLIC PANEL
ALT: 26 U/L (ref 14–54)
ANION GAP: 6 (ref 5–15)
AST: 28 U/L (ref 15–41)
Albumin: 4 g/dL (ref 3.5–5.0)
Alkaline Phosphatase: 63 U/L (ref 38–126)
BUN: 10 mg/dL (ref 6–20)
CALCIUM: 9.5 mg/dL (ref 8.9–10.3)
CHLORIDE: 106 mmol/L (ref 101–111)
CO2: 26 mmol/L (ref 22–32)
CREATININE: 0.76 mg/dL (ref 0.44–1.00)
GFR calc Af Amer: >60 mL/min (ref >60)
Glucose, Bld: 95 mg/dL (ref 65–99)
Potassium: 4.3 mmol/L (ref 3.5–5.1)
Sodium: 138 mmol/L (ref 135–145)
Total Bilirubin: 0.7 mg/dL (ref 0.3–1.2)
Total Protein: 7.4 g/dL (ref 6.5–8.1)

## 2016-11-06 LAB — URINALYSIS, ROUTINE W REFLEX MICROSCOPIC
Bacteria, UA: NONE SEEN
Bilirubin Urine: NEGATIVE
GLUCOSE, UA: NEGATIVE mg/dL
Hgb urine dipstick: NEGATIVE
Ketones, ur: 5 mg/dL — AB
NITRITE: NEGATIVE
PH: 6 (ref 5.0–8.0)
PROTEIN: NEGATIVE mg/dL
Specific Gravity, Urine: 1.02 (ref 1.005–1.030)

## 2016-11-06 LAB — LIPASE, BLOOD: LIPASE: 23 U/L (ref 11–51)

## 2016-11-06 LAB — T4, FREE: FREE T4: 1 ng/dL (ref 0.61–1.12)

## 2016-11-06 LAB — SURGICAL PCR SCREEN
MRSA, PCR: NEGATIVE
Staphylococcus aureus: NEGATIVE

## 2016-11-06 MED ORDER — DEXTROSE 5 % IV SOLN
2.0000 g | INTRAVENOUS | Status: DC
  Administered 2016-11-06 – 2016-11-07 (×2): 2 g via INTRAVENOUS
  Filled 2016-11-06 (×3): qty 2

## 2016-11-06 MED ORDER — GI COCKTAIL ~~LOC~~
30.0000 mL | Freq: Once | ORAL | Status: AC
  Administered 2016-11-06: 30 mL via ORAL
  Filled 2016-11-06: qty 30

## 2016-11-06 MED ORDER — PANTOPRAZOLE SODIUM 40 MG PO TBEC
40.0000 mg | DELAYED_RELEASE_TABLET | Freq: Every day | ORAL | Status: DC
Start: 2016-11-06 — End: 2016-11-07
  Administered 2016-11-06: 40 mg via ORAL
  Filled 2016-11-06: qty 1

## 2016-11-06 MED ORDER — MORPHINE SULFATE (PF) 4 MG/ML IV SOLN
2.0000 mg | INTRAVENOUS | Status: DC | PRN
  Administered 2016-11-06 – 2016-11-07 (×3): 2 mg via INTRAVENOUS
  Filled 2016-11-06 (×3): qty 1

## 2016-11-06 MED ORDER — DIPHENHYDRAMINE HCL 25 MG PO CAPS: 25.0000 mg | ORAL_CAPSULE | Freq: Four times a day (QID) | ORAL | Status: DC | PRN

## 2016-11-06 MED ORDER — OXYCODONE HCL 5 MG PO TABS
5.0000 mg | ORAL_TABLET | ORAL | Status: DC | PRN
  Administered 2016-11-07 – 2016-11-08 (×3): 10 mg via ORAL
  Filled 2016-11-06 (×3): qty 2

## 2016-11-06 MED ORDER — KCL IN DEXTROSE-NACL 20-5-0.45 MEQ/L-%-% IV SOLN
INTRAVENOUS | Status: DC
  Administered 2016-11-06 – 2016-11-07 (×2): via INTRAVENOUS
  Filled 2016-11-06 (×3): qty 1000

## 2016-11-06 MED ORDER — ACETAMINOPHEN 325 MG PO TABS: 650.0000 mg | ORAL_TABLET | Freq: Four times a day (QID) | ORAL | Status: DC | PRN

## 2016-11-06 MED ORDER — MORPHINE SULFATE (PF) 4 MG/ML IV SOLN
4.0000 mg | Freq: Once | INTRAVENOUS | Status: AC
  Administered 2016-11-06: 4 mg via INTRAVENOUS
  Filled 2016-11-06: qty 1

## 2016-11-06 MED ORDER — ACETAMINOPHEN 650 MG RE SUPP: 650.0000 mg | Freq: Four times a day (QID) | RECTAL | Status: DC | PRN

## 2016-11-06 MED ORDER — SODIUM CHLORIDE 0.9 % IV BOLUS (SEPSIS)
1000.0000 mL | Freq: Once | INTRAVENOUS | Status: AC
  Administered 2016-11-06: 1000 mL via INTRAVENOUS

## 2016-11-06 MED ORDER — DIPHENHYDRAMINE HCL 50 MG/ML IJ SOLN: 25.0000 mg | Freq: Four times a day (QID) | INTRAMUSCULAR | Status: DC | PRN

## 2016-11-06 NOTE — ED Notes (Signed)
Phlebotomy at the bedside  

## 2016-11-06 NOTE — ED Notes (Signed)
Attempted IV x 3. No access obtained.

## 2016-11-06 NOTE — Progress Notes (Signed)
Patient arrived 6n05 alert and oriented, mild pain to abdomen. IV intact and saline locked. Visitors in waiting room. Oriented to room and staff,will continue to monitor.

## 2016-11-06 NOTE — ED Triage Notes (Signed)
Pt reports n/v x 2 weeks with upper abd pain since last night. Went for outpatient US today and sent here for further eval. No acute distress is noted at triage.

## 2016-11-06 NOTE — ED Notes (Signed)
Admitting PA at the bedside.

## 2016-11-06 NOTE — ED Provider Notes (Signed)
[facility] Provider Note   CSN: [id number] Arrival date & time: 11/06/16  1117     History   Chief Complaint Chief Complaint  Patient presents with  . Abdominal Pain    HPI Sheryl Mason is a 36 y.o. female with history of GERD, obesity, and vertigo who presents today with chief complaint acute onset, gradually worsening nausea and vomiting for 2 weeks. She states that she has been experiencing persistent nausea and 2-3 episodes of emesis which are occasionally blood-streaked. She also drinks intermittent epigastric and right upper quadrant pain that is described as a "tightness and twisting". Pain worsens after meals. She had one week of associated watery stools that and is now experiencing constipation. Last bowel movement was yesterday and described as hard. Additional associated symptoms include mild aching low back pain and intermittent right shoulder pain. She states she was seen by her primary care last week and blood work was obtained. With persisting symptoms, she called her primary care office who recommended outpatient abdominal ultrasound and give her Zofran which has been helpful for nausea. She states that this morning at around 6 AM she experienced an episode of epigastric abdominal pain that was severe and radiated up into her chest with associated shortness of breath and anxiety. The episode lasted for several minutes before resolving. Was not associated with diaphoresis or syncope. She obtained her ultrasound which showed "Cholelithiasis is present with a large gallstone measuring 2.3 x 1.3 x 2.1 cm. The gallbladder wall thickening or pericholecystic fluid. Positive sonographic Murphy sign. The liver, biliary tree, pancreas, right kidney, aorta, and IVC are  normal. The portal vein is patent with hepatopedal flow documented by duplex color with spectral analysis." Her primary care recommended presentation to the ED for further workup.  The history is provided by  the patient.    Past Medical History:  Diagnosis Date  . Acid reflux   . Obesity   . Vertigo     Patient Active Problem List   Diagnosis Date Noted  . Acute calculous cholecystitis 11/06/2016    History reviewed. No pertinent surgical history.  OB History    Gravida Para Term Preterm AB Living   4 3 0 0 0 3   SAB TAB Ectopic Multiple Live Births   0 0 0 0         Home Medications    Prior to Admission medications   Medication Sig Start Date End Date Taking? Authorizing Provider  acetaminophen (TYLENOL) 500 MG tablet Take 500 mg by mouth every 6 (six) hours as needed for mild pain.   Yes [provider]  levonorgestrel (MIRENA) 20 MCG/24HR IUD 1 each by Intrauterine route once.   Yes [provider]  omeprazole (PRILOSEC) 20 MG capsule Take 20 mg by mouth daily. 10/25/16  Yes [provider]  ondansetron (ZOFRAN) 8 MG tablet Take 8 mg by mouth every 8 (eight) hours as needed for nausea. 11/05/16  Yes [provider]  naproxen (NAPROSYN) 500 MG tablet Take 1 tablet (500 mg total) by mouth 2 (two) times daily. Patient not taking: Reported on 11/06/2016 11/18/15   Cheri Fowlerose, Kayla, PA-C    Family History History reviewed. No pertinent family history.  Social History Social History  Substance Use Topics  . Smoking status: Never Smoker  . Smokeless tobacco: Not on file  . Alcohol use No     Comment: occasional     Allergies   Patient has no known allergies.   Review  of Systems Review of Systems  Constitutional: Negative for chills and fever.  Respiratory: Positive for shortness of breath.   Cardiovascular: Negative for chest pain.  Gastrointestinal: Positive for abdominal pain, constipation, diarrhea, nausea and vomiting. Negative for blood in stool.  Genitourinary: Positive for frequency. Negative for dysuria, flank pain and hematuria.  Musculoskeletal: Positive for back pain.  All other systems reviewed and are  negative.    Physical Exam Updated Vital Signs BP (!) 117/55 (BP Location: Right Arm)   Pulse 73   Temp 98.7 F (37.1 C) (Oral)   Resp 16   Ht 5\' 5"  (1.651 m)   Wt (!) 139.3 kg (307 lb)   SpO2 100%   BMI 51.09 kg/m   Physical Exam  Constitutional: She appears well-developed and well-nourished. No distress.  HENT:  Head: Normocephalic and atraumatic.  Eyes: Conjunctivae are normal. Right eye exhibits no discharge. Left eye exhibits no discharge.  Neck: No JVD present. No tracheal deviation present.  Cardiovascular: Normal rate, regular rhythm, normal heart sounds and intact distal pulses.   Pulmonary/Chest: Effort normal and breath sounds normal.  Abdominal: Soft. Bowel sounds are normal. She exhibits no distension. There is tenderness.  Epigastric and right upper quadrant tenderness. Murphy sign absent, Rovsing sign absent, no tenderness to palpation of McBurney's point. No CVA tenderness.  Musculoskeletal: She exhibits tenderness. She exhibits no edema.  No midline spine TTP. Bilateral lumbar spine paraspinal muscle tenderness. No deformity, crepitus, or step-off noted.  Neurological: She is alert.  Skin: Skin is warm and dry. Capillary refill takes less than 2 seconds. No erythema.  Psychiatric: She has a normal mood and affect. Her behavior is normal.  Nursing note and vitals reviewed.    ED Treatments / Results  Labs (all labs ordered are listed, but only abnormal results are displayed) Labs Reviewed  CBC - Abnormal; Notable for the following:       Result Value   WBC 13.0 (*)    All other components within normal limits  URINALYSIS, ROUTINE W REFLEX MICROSCOPIC - Abnormal; Notable for the following:    APPearance CLOUDY (*)    Ketones, ur 5 (*)    Leukocytes, UA LARGE (*)    Squamous Epithelial / LPF 6-30 (*)    All other components within normal limits  LIPASE, BLOOD  COMPREHENSIVE METABOLIC PANEL  HIV ANTIBODY (ROUTINE TESTING)  T4, FREE  T3, FREE  I-STAT  TROPONIN, ED    EKG  EKG Interpretation  Date/Time:  Friday November 06 2016 12:14:21 EDT Ventricular Rate:  67 PR Interval:    QRS Duration: 99 QT Interval:  403 QTC Calculation: 426 R Axis:   48 Text Interpretation:  Sinus rhythm Borderline low voltage, extremity leads No old tracing to compare Confirmed by Linwood DibblesKnapp, Jon [phone]) on 11/06/2016 12:17:21 PM       Radiology No results found.  Procedures Procedures (including critical care time)  Medications Ordered in ED Medications  dextrose 5 % and 0.45 % NaCl with KCl 20 mEq/L infusion ( Intravenous New Bag/Given 11/06/16 1631)  cefTRIAXone (ROCEPHIN) 2 g in dextrose 5 % 50 mL IVPB (not administered)  acetaminophen (TYLENOL) tablet 650 mg (not administered)    Or  acetaminophen (TYLENOL) suppository 650 mg (not administered)  oxyCODONE (Oxy IR/ROXICODONE) immediate release tablet 5-10 mg (not administered)  morphine 4 MG/ML injection 2 mg (2 mg Intravenous Given 11/06/16 1547)  diphenhydrAMINE (BENADRYL) capsule 25 mg (not administered)    Or  diphenhydrAMINE (BENADRYL) injection 25 mg (  not administered)  pantoprazole (PROTONIX) EC tablet 40 mg (40 mg Oral Given 11/06/16 1631)  sodium chloride 0.9 % bolus 1,000 mL (0 mLs Intravenous Stopped 11/06/16 1400)  gi cocktail (Maalox,Lidocaine,Donnatal) (30 mLs Oral Given 11/06/16 1211)  morphine 4 MG/ML injection 4 mg (4 mg Intravenous Given 11/06/16 1317)     Initial Impression / Assessment and Plan / ED Course  I have reviewed the triage vital signs and the nursing notes.  Pertinent labs & imaging results that were available during my care of the patient were reviewed by me and considered in my medical decision making (see chart for details).     Patient with 2 weeks of nausea, vomiting, and one week of diarrhea followed by one week of constipation. Had a worsening episode which involved shortness of breath and anxiety earlier today. Afebrile, vital signs are stable. Ultrasound obtained in  the outpatient setting shows evidence of cholelithiasis without definitive evidence of cholecystitis. EKG shows sinus rhythm and troponin is negative, doubt ACS/MI or cardiopulmonary source of her symptoms. CBC shows mild nonspecific leukocytosis. CMP shows no elevated LFTs, lipase is normal, and the UA is not concerning for UTI or nephrolithiasis. Little to no relief after administration of the GI cocktail and fluids. Significantly from 8/10 to 3/10 after administration of morphine, however patient endorses that pain is persisting and she continues to feel abdominal "tightness ". She states that she would prefer surgical intervention sooner rather than later. Spoke with Broke, PA-C with gen surg who will evaluate patient.   3:23PM General surgery agrees to assume care of patient and bring her into the hospital for further evaluation of symptomatic cholelithiasis with plan to repeat CMP in a.m. and plan for laparoscopic cholecystectomy tomorrow. Final Clinical Impressions(s) / ED Diagnoses   Final diagnoses:  Nausea vomiting and diarrhea    New Prescriptions Current Discharge Medication List       Bennye Alm 11/06/16 1637    Linwood Dibbles, MD 11/07/16 [phone]

## 2016-11-06 NOTE — ED Notes (Signed)
IV Team at the bedside. 

## 2016-11-06 NOTE — ED Notes (Signed)
General Surgery at the bedside

## 2016-11-06 NOTE — H&P (Signed)
[facility] Surgery Admission Note  Sheryl Mason 07-17-1980  [id number].    Requesting MD: Tomi Bamberger Chief Complaint/Reason for Consult: Cholelithiasis  HPI:  Sheryl Mason is a 36yo female who presented to [facility] earlier today with persistent RUQ pain, nausea, vomiting, and diarrhea. Patient states that she has had intermittent episodes for several years, but never this severe. States that this episode began 2 weeks ago and has gradually gotten worse. The pain is located RUQ, now constant and severe. It is worse with PO intake. Associated with nausea, vomiting, diarrhea, and chills. Denies fever or dysuria.  Last meal was earlier this morning. Spoke with PCP who ordered an u/s which read "Cholelithiasis is present with a large gallstone measuring 2.3 x 1.3 x 2.1 cm. The gallbladder wall thickening or pericholecystic fluid. Positive sonographic Murphy sign. The liver, biliary tree, pancreas, right kidney, aorta, and IVC are normal. The portal vein is patent with hepatopedal flow documented by duplex color with spectral analysis."  Came to ED because she could not resolve her abdominal pain, and she felt SOB this morning.  Hospital workup: - Outside u/s read "Cholelithiasis is present with a large gallstone measuring 2.3 x 1.3 x 2.1 cm. The gallbladder wall thickening or pericholecystic fluid. Positive sonographic Murphy sign. The liver, biliary tree, pancreas, right kidney, aorta, and IVC are  normal. The portal vein is patent with hepatopedal flow documented by duplex color with spectral analysis."  - LFTs WNL, lipase WNL - WBC 13, afebrile  PMH significant for GERD Abdominal surgical history: none Anticoagulants: none Nonsmoker Employment: works at [employer] in [place]: Review of Systems  Constitutional: Positive for chills. Negative for fever.  HENT: Negative.   Eyes: Negative.   Respiratory: Negative.   Cardiovascular: Negative.   Gastrointestinal: Positive  for abdominal pain, constipation, diarrhea, heartburn, nausea and vomiting. Negative for blood in stool and melena.  Genitourinary: Negative.   Musculoskeletal: Negative.   Skin: Negative.   Neurological: Negative.   All systems reviewed and otherwise negative except for as above  History reviewed. No pertinent family history.  Past Medical History:  Diagnosis Date  . Acid reflux   . Obesity   . Vertigo     History reviewed. No pertinent surgical history.  Social History:  reports that she has never smoked. She does not have any smokeless tobacco history on file. She reports that she does not drink alcohol or use drugs.  Allergies: No Known Allergies   (Not in a hospital admission)  Prior to Admission medications   Medication Sig Start Date End Date Taking? Authorizing Provider  acetaminophen (TYLENOL) 500 MG tablet Take 500 mg by mouth every 6 (six) hours as needed for mild pain.   Yes [provider]  levonorgestrel (MIRENA) 20 MCG/24HR IUD 1 each by Intrauterine route once.   Yes [provider]  omeprazole (PRILOSEC) 20 MG capsule Take 20 mg by mouth daily. 10/25/16  Yes [provider]  ondansetron (ZOFRAN) 8 MG tablet Take 8 mg by mouth every 8 (eight) hours as needed for nausea. 11/05/16  Yes [provider]  naproxen (NAPROSYN) 500 MG tablet Take 1 tablet (500 mg total) by mouth 2 (two) times daily. Patient not taking: Reported on 11/06/2016 11/18/15   Gloriann Loan, PA-C    Blood pressure 132/81, pulse 71, temperature (!) 97.3 F (36.3 C), temperature source Oral, resp. rate 16, height 5' 5"  (1.651 m), weight (!) 307 lb (139.3 kg), SpO2 100 %, unknown if currently  breastfeeding. Physical Exam: General: pleasant, WD/WN white female who is laying in bed in NAD HEENT: head is normocephalic, atraumatic.  Sclera are noninjected.  Pupils equal and round.  Ears and nose without any masses or lesions.  Mouth is pink and moist. Dentition fair Heart:  regular, rate, and rhythm.  No obvious murmurs, gallops, or rubs noted.  Palpable pedal pulses bilaterally Lungs: CTAB, no wheezes, rhonchi, or rales noted.  Respiratory effort nonlabored Abd: obese, soft, ND, +BS, no masses, hernias, or organomegaly. +TTP RUQ and epigastric region. No rebound or guarding. +Murphy sign MS: all 4 extremities are symmetrical with no cyanosis, clubbing, or edema. Skin: warm and dry with no masses, lesions, or rashes Psych: A&Ox3 with an appropriate affect. Neuro: cranial nerves grossly intact, extremity CSM intact bilaterally, normal speech  Results for orders placed or performed during the hospital encounter of 11/06/16 (from the past 48 hour(s))  Lipase, blood     Status: None   Collection Time: 11/06/16 12:07 PM  Result Value Ref Range   Lipase 23 11 - 51 U/L  Comprehensive metabolic panel     Status: None   Collection Time: 11/06/16 12:07 PM  Result Value Ref Range   Sodium 138 135 - 145 mmol/L   Potassium 4.3 3.5 - 5.1 mmol/L   Chloride 106 101 - 111 mmol/L   CO2 26 22 - 32 mmol/L   Glucose, Bld 95 65 - 99 mg/dL   BUN 10 6 - 20 mg/dL   Creatinine, Ser 0.76 0.44 - 1.00 mg/dL   Calcium 9.5 8.9 - 10.3 mg/dL   Total Protein 7.4 6.5 - 8.1 g/dL   Albumin 4.0 3.5 - 5.0 g/dL   AST 28 15 - 41 U/L   ALT 26 14 - 54 U/L   Alkaline Phosphatase 63 38 - 126 U/L   Total Bilirubin 0.7 0.3 - 1.2 mg/dL   GFR calc non Af Amer >60 >60 mL/min   GFR calc Af Amer >60 >60 mL/min    Comment: (NOTE) The eGFR has been calculated using the CKD EPI equation. This calculation has not been validated in all clinical situations. eGFR's persistently <60 mL/min signify possible Chronic Kidney Disease.    Anion gap 6 5 - 15  CBC     Status: Abnormal   Collection Time: 11/06/16 12:07 PM  Result Value Ref Range   WBC 13.0 (H) 4.0 - 10.5 K/uL   RBC 4.53 3.87 - 5.11 MIL/uL   Hemoglobin 13.0 12.0 - 15.0 g/dL   HCT 40.1 36.0 - 46.0 %   MCV 88.5 78.0 - 100.0 fL   MCH 28.7 26.0  - 34.0 pg   MCHC 32.4 30.0 - 36.0 g/dL   RDW 13.7 11.5 - 15.5 %   Platelets 228 150 - 400 K/uL  I-stat troponin, ED     Status: None   Collection Time: 11/06/16 12:17 PM  Result Value Ref Range   Troponin i, poc 0.00 0.00 - 0.08 ng/mL   Comment 3            Comment: Due to the release kinetics of cTnI, a negative result within the first hours of the onset of symptoms does not rule out myocardial infarction with certainty. If myocardial infarction is still suspected, repeat the test at appropriate intervals.   Urinalysis, Routine w reflex microscopic     Status: Abnormal   Collection Time: 11/06/16  1:07 PM  Result Value Ref Range   Color, Urine YELLOW YELLOW  APPearance CLOUDY (A) CLEAR   Specific Gravity, Urine 1.020 1.005 - 1.030   pH 6.0 5.0 - 8.0   Glucose, UA NEGATIVE NEGATIVE mg/dL   Hgb urine dipstick NEGATIVE NEGATIVE   Bilirubin Urine NEGATIVE NEGATIVE   Ketones, ur 5 (A) NEGATIVE mg/dL   Protein, ur NEGATIVE NEGATIVE mg/dL   Nitrite NEGATIVE NEGATIVE   Leukocytes, UA LARGE (A) NEGATIVE   RBC / HPF 0-5 0 - 5 RBC/hpf   WBC, UA 6-30 0 - 5 WBC/hpf   Bacteria, UA NONE SEEN NONE SEEN   Squamous Epithelial / LPF 6-30 (A) NONE SEEN   Mucous PRESENT    No results found.    Assessment/Plan Obesity Elevated TSH (6.16) - checking free T3/T4 GERD - PO protonix  Symptomatic cholelithiasis - no previous abdominal surgeries - intermittent biliary colic for years, worse x2 weeks with associated nausea, vomiting, and diarrhea - Outside u/s read "Cholelithiasis is present with a large gallstone measuring 2.3 x 1.3 x 2.1 cm. The gallbladder wall thickening or pericholecystic fluid. Positive sonographic Murphy sign. The liver, biliary tree, pancreas, right kidney, aorta, and IVC are  normal. The portal vein is patent with hepatopedal flow documented by duplex color with spectral analysis."  - LFTs WNL, lipase WNL - WBC 13, afebrile  ID - rocephin/flagyl 8/3>> VTE -  SCDs FEN - IVF, clear liquids today and NPO after midnight  Plan - Admit to med-surg. Start IV antibiotics. Hayden for clears today, NPO after midnight. Will repeat CMP in AM and plan for lap chole tomorrow.  Wellington Hampshire, [facility] 11/06/2016, 3:23 PM Pager: [phone] Consults: [phone] Mon-Fri 7:00 am-4:30 pm Sat-Sun 7:00 am-11:30 am

## 2016-11-06 NOTE — Anesthesia Preprocedure Evaluation (Signed)
Anesthesia Evaluation  Patient identified by MRN, date of birth, ID band Patient awake    Reviewed: Allergy & Precautions, NPO status , Patient's Chart, lab work & pertinent test results  Airway Mallampati: II  TM Distance: >3 FB Neck ROM: Full    Dental no notable dental hx.    Pulmonary neg pulmonary ROS,    Pulmonary exam normal breath sounds clear to auscultation       Cardiovascular negative cardio ROS Normal cardiovascular exam Rhythm:Regular Rate:Normal     Neuro/Psych negative neurological ROS  negative psych ROS   GI/Hepatic Neg liver ROS, GERD  ,  Endo/Other  negative endocrine ROS  Renal/GU negative Renal ROS  negative genitourinary   Musculoskeletal negative musculoskeletal ROS (+)   Abdominal   Peds negative pediatric ROS (+)  Hematology negative hematology ROS (+)   Anesthesia Other Findings   Reproductive/Obstetrics negative OB ROS                             Anesthesia Physical Anesthesia Plan  ASA: II  Anesthesia Plan: General   Post-op Pain Management:    Induction: Intravenous  PONV Risk Score and Plan: 2 and Ondansetron and Dexamethasone  Airway Management Planned: Oral ETT  Additional Equipment:   Intra-op Plan:   Post-operative Plan: Extubation in OR  Informed Consent: I have reviewed the patients History and Physical, chart, labs and discussed the procedure including the risks, benefits and alternatives for the proposed anesthesia with the patient or authorized representative who has indicated his/her understanding and acceptance.   Dental advisory given  Plan Discussed with: CRNA and Surgeon  Anesthesia Plan Comments:         Anesthesia Quick Evaluation

## 2016-11-07 ENCOUNTER — Observation Stay (HOSPITAL_COMMUNITY): Payer: Managed Care, Other (non HMO) | Admitting: Anesthesiology

## 2016-11-07 ENCOUNTER — Encounter (HOSPITAL_COMMUNITY): Payer: Self-pay | Admitting: Anesthesiology

## 2016-11-07 ENCOUNTER — Encounter (HOSPITAL_COMMUNITY)
Admission: EM | Disposition: A | Discharge status: Home / Self Care | Payer: Self-pay | Source: Home / Self Care | Attending: Emergency Medicine

## 2016-11-07 HISTORY — PX: CHOLECYSTECTOMY: SHX55

## 2016-11-07 LAB — T3, FREE: T3 FREE: 3.4 pg/mL (ref 2.0–4.4)

## 2016-11-07 SURGERY — LAPAROSCOPIC CHOLECYSTECTOMY
Anesthesia: General | Site: Abdomen

## 2016-11-07 MED ORDER — ONDANSETRON HCL 4 MG/2ML IJ SOLN
4.0000 mg | Freq: Four times a day (QID) | INTRAMUSCULAR | Status: DC | PRN
  Administered 2016-11-07: 4 mg via INTRAVENOUS

## 2016-11-07 MED ORDER — NEOSTIGMINE METHYLSULFATE 5 MG/5ML IV SOSY
PREFILLED_SYRINGE | INTRAVENOUS | Status: AC
  Filled 2016-11-07: qty 5

## 2016-11-07 MED ORDER — LIDOCAINE HCL (CARDIAC) 20 MG/ML IV SOLN
INTRAVENOUS | Status: DC | PRN
  Administered 2016-11-07: 70 mg via INTRAVENOUS

## 2016-11-07 MED ORDER — BUPIVACAINE-EPINEPHRINE (PF) 0.25% -1:200000 IJ SOLN
INTRAMUSCULAR | Status: AC
Start: 2016-11-07 — End: 2016-11-07
  Filled 2016-11-07: qty 30

## 2016-11-07 MED ORDER — ROCURONIUM BROMIDE 100 MG/10ML IV SOLN
INTRAVENOUS | Status: DC | PRN
  Administered 2016-11-07: 60 mg via INTRAVENOUS

## 2016-11-07 MED ORDER — PROPOFOL 10 MG/ML IV BOLUS
INTRAVENOUS | Status: AC
  Filled 2016-11-07: qty 20

## 2016-11-07 MED ORDER — SODIUM CHLORIDE 0.9 % IR SOLN
Status: DC | PRN
  Administered 2016-11-07: 1000 mL

## 2016-11-07 MED ORDER — MIDAZOLAM HCL 2 MG/2ML IJ SOLN
INTRAMUSCULAR | Status: AC
  Filled 2016-11-07: qty 2

## 2016-11-07 MED ORDER — MIDAZOLAM HCL 5 MG/5ML IJ SOLN
INTRAMUSCULAR | Status: DC | PRN
  Administered 2016-11-07: 2 mg via INTRAVENOUS

## 2016-11-07 MED ORDER — 0.9 % SODIUM CHLORIDE (POUR BTL) OPTIME
TOPICAL | Status: DC | PRN
  Administered 2016-11-07: 1000 mL

## 2016-11-07 MED ORDER — GLYCOPYRROLATE 0.2 MG/ML IJ SOLN
INTRAMUSCULAR | Status: DC | PRN
  Administered 2016-11-07: .8 mg via INTRAVENOUS

## 2016-11-07 MED ORDER — HEMOSTATIC AGENTS (NO CHARGE) OPTIME
TOPICAL | Status: DC | PRN
  Administered 2016-11-07: 1

## 2016-11-07 MED ORDER — DEXAMETHASONE SODIUM PHOSPHATE 10 MG/ML IJ SOLN
INTRAMUSCULAR | Status: DC | PRN
  Administered 2016-11-07: 10 mg via INTRAVENOUS

## 2016-11-07 MED ORDER — ROCURONIUM BROMIDE 10 MG/ML (PF) SYRINGE
PREFILLED_SYRINGE | INTRAVENOUS | Status: AC
  Filled 2016-11-07: qty 5

## 2016-11-07 MED ORDER — LACTATED RINGERS IV SOLN
INTRAVENOUS | Status: DC | PRN
  Administered 2016-11-07 (×2): via INTRAVENOUS

## 2016-11-07 MED ORDER — FENTANYL CITRATE (PF) 250 MCG/5ML IJ SOLN
INTRAMUSCULAR | Status: AC
  Filled 2016-11-07: qty 5

## 2016-11-07 MED ORDER — ONDANSETRON HCL 4 MG/2ML IJ SOLN
INTRAMUSCULAR | Status: AC
  Filled 2016-11-07: qty 4

## 2016-11-07 MED ORDER — PROPOFOL 10 MG/ML IV BOLUS
INTRAVENOUS | Status: DC | PRN
  Administered 2016-11-07: 200 mg via INTRAVENOUS

## 2016-11-07 MED ORDER — HYDROMORPHONE HCL 1 MG/ML IJ SOLN
INTRAMUSCULAR | Status: AC
  Filled 2016-11-07: qty 1

## 2016-11-07 MED ORDER — PROMETHAZINE HCL 25 MG/ML IJ SOLN
INTRAMUSCULAR | Status: AC
  Filled 2016-11-07: qty 1

## 2016-11-07 MED ORDER — NEOSTIGMINE METHYLSULFATE 10 MG/10ML IV SOLN
INTRAVENOUS | Status: DC | PRN
  Administered 2016-11-07: 5 mg via INTRAVENOUS

## 2016-11-07 MED ORDER — ONDANSETRON HCL 4 MG/2ML IJ SOLN
INTRAMUSCULAR | Status: DC | PRN
  Administered 2016-11-07 (×2): 4 mg via INTRAVENOUS

## 2016-11-07 MED ORDER — KETOROLAC TROMETHAMINE 30 MG/ML IJ SOLN
INTRAMUSCULAR | Status: AC
  Filled 2016-11-07: qty 1

## 2016-11-07 MED ORDER — FENTANYL CITRATE (PF) 100 MCG/2ML IJ SOLN
INTRAMUSCULAR | Status: DC | PRN
  Administered 2016-11-07: 100 ug via INTRAVENOUS
  Administered 2016-11-07 (×3): 50 ug via INTRAVENOUS

## 2016-11-07 MED ORDER — HYDROMORPHONE HCL 1 MG/ML IJ SOLN
0.2500 mg | INTRAMUSCULAR | Status: DC | PRN
  Administered 2016-11-07: 0.5 mg via INTRAVENOUS

## 2016-11-07 MED ORDER — PANTOPRAZOLE SODIUM 40 MG PO TBEC
40.0000 mg | DELAYED_RELEASE_TABLET | Freq: Every day | ORAL | Status: DC
  Administered 2016-11-07 – 2016-11-08 (×2): 40 mg via ORAL
  Filled 2016-11-07 (×2): qty 1

## 2016-11-07 MED ORDER — PROMETHAZINE HCL 25 MG/ML IJ SOLN
6.2500 mg | INTRAMUSCULAR | Status: DC | PRN
  Administered 2016-11-07: 6.25 mg via INTRAVENOUS

## 2016-11-07 MED ORDER — KETOROLAC TROMETHAMINE 30 MG/ML IJ SOLN
30.0000 mg | Freq: Once | INTRAMUSCULAR | Status: DC | PRN
  Administered 2016-11-07: 30 mg via INTRAVENOUS

## 2016-11-07 MED ORDER — LIDOCAINE 2% (20 MG/ML) 5 ML SYRINGE
INTRAMUSCULAR | Status: AC
  Filled 2016-11-07: qty 5

## 2016-11-07 MED ORDER — DEXAMETHASONE SODIUM PHOSPHATE 10 MG/ML IJ SOLN
INTRAMUSCULAR | Status: AC
  Filled 2016-11-07: qty 1

## 2016-11-07 MED ORDER — ALUM & MAG HYDROXIDE-SIMETH 200-200-20 MG/5ML PO SUSP
30.0000 mL | Freq: Four times a day (QID) | ORAL | Status: DC | PRN
Start: 2016-11-07 — End: 2016-11-08
  Administered 2016-11-07: 30 mL via ORAL
  Filled 2016-11-07: qty 30

## 2016-11-07 MED ORDER — CEFAZOLIN SODIUM-DEXTROSE 2-3 GM-% IV SOLR
INTRAVENOUS | Status: DC | PRN
  Administered 2016-11-07: 2 g via INTRAVENOUS

## 2016-11-07 MED ORDER — BUPIVACAINE-EPINEPHRINE 0.25% -1:200000 IJ SOLN
INTRAMUSCULAR | Status: DC | PRN
  Administered 2016-11-07: 20 mL

## 2016-11-07 SURGICAL SUPPLY — 43 items
APPLIER CLIP 5 13 M/L LIGAMAX5 (MISCELLANEOUS) ×4
BLADE CLIPPER SURG (BLADE) IMPLANT
CANISTER SUCT 3000ML PPV (MISCELLANEOUS) ×4 IMPLANT
CHLORAPREP W/TINT 26ML (MISCELLANEOUS) ×4 IMPLANT
CLIP APPLIE 5 13 M/L LIGAMAX5 (MISCELLANEOUS) ×2 IMPLANT
CLOSURE WOUND 1/2 X4 (GAUZE/BANDAGES/DRESSINGS) ×1
COVER MAYO STAND STRL (DRAPES) IMPLANT
COVER SURGICAL LIGHT HANDLE (MISCELLANEOUS) ×4 IMPLANT
DERMABOND ADHESIVE PROPEN (GAUZE/BANDAGES/DRESSINGS) ×2
DERMABOND ADVANCED (GAUZE/BANDAGES/DRESSINGS) ×2
DERMABOND ADVANCED .7 DNX12 (GAUZE/BANDAGES/DRESSINGS) ×2 IMPLANT
DERMABOND ADVANCED .7 DNX6 (GAUZE/BANDAGES/DRESSINGS) ×2 IMPLANT
DEVICE TROCAR PUNCTURE CLOSURE (ENDOMECHANICALS) ×4 IMPLANT
DRAPE C-ARM 42X72 X-RAY (DRAPES) IMPLANT
ELECT REM PT RETURN 9FT ADLT (ELECTROSURGICAL) ×4
ELECTRODE REM PT RTRN 9FT ADLT (ELECTROSURGICAL) ×2 IMPLANT
GLOVE BIO SURGEON STRL SZ7 (GLOVE) ×4 IMPLANT
GLOVE BIOGEL PI IND STRL 7.5 (GLOVE) ×2 IMPLANT
GLOVE BIOGEL PI INDICATOR 7.5 (GLOVE) ×2
GOWN STRL REUS W/ TWL LRG LVL3 (GOWN DISPOSABLE) ×6 IMPLANT
GOWN STRL REUS W/TWL LRG LVL3 (GOWN DISPOSABLE) ×6
KIT BASIN OR (CUSTOM PROCEDURE TRAY) ×4 IMPLANT
KIT ROOM TURNOVER OR (KITS) ×4 IMPLANT
NS IRRIG 1000ML POUR BTL (IV SOLUTION) ×4 IMPLANT
PAD ARMBOARD 7.5X6 YLW CONV (MISCELLANEOUS) ×4 IMPLANT
POUCH RETRIEVAL ECOSAC 10 (ENDOMECHANICALS) ×2 IMPLANT
POUCH RETRIEVAL ECOSAC 10MM (ENDOMECHANICALS) ×2
SCISSORS LAP 5X35 DISP (ENDOMECHANICALS) ×4 IMPLANT
SET CHOLANGIOGRAPH 5 50 .035 (SET/KITS/TRAYS/PACK) IMPLANT
SET IRRIG TUBING LAPAROSCOPIC (IRRIGATION / IRRIGATOR) ×4 IMPLANT
SLEEVE ENDOPATH XCEL 5M (ENDOMECHANICALS) ×4 IMPLANT
SPECIMEN JAR SMALL (MISCELLANEOUS) ×4 IMPLANT
STRIP CLOSURE SKIN 1/2X4 (GAUZE/BANDAGES/DRESSINGS) ×3 IMPLANT
SUT MNCRL AB 4-0 PS2 18 (SUTURE) ×4 IMPLANT
SUT VICRYL 0 UR6 27IN ABS (SUTURE) ×4 IMPLANT
TOWEL OR 17X24 6PK STRL BLUE (TOWEL DISPOSABLE) ×4 IMPLANT
TOWEL OR 17X26 10 PK STRL BLUE (TOWEL DISPOSABLE) ×4 IMPLANT
TRAY LAPAROSCOPIC MC (CUSTOM PROCEDURE TRAY) ×4 IMPLANT
TROCAR 5M 150ML BLDLS (TROCAR) ×4 IMPLANT
TROCAR BLADELESS 12MM (ENDOMECHANICALS) ×4 IMPLANT
TROCAR XCEL BLUNT TIP 100MML (ENDOMECHANICALS) ×4 IMPLANT
TROCAR XCEL NON-BLD 5MMX100MML (ENDOMECHANICALS) ×4 IMPLANT
TUBING INSUFFLATION (TUBING) ×4 IMPLANT

## 2016-11-07 NOTE — Interval H&P Note (Signed)
History and Physical Interval Note:  11/07/2016 8:06 AM I have reviewed patients history, labs, Sheryl Mason report. Agree with lap chole. Will proceed today.  Sheryl Mason  has presented today for surgery, with the diagnosis of Acute Cholecystits  The various methods of treatment have been discussed with the patient and family. After consideration of risks, benefits and other options for treatment, the patient has consented to  Procedure(s): LAPAROSCOPIC CHOLECYSTECTOMY WITH INTRAOPERATIVE CHOLANGIOGRAM (N/A) as a surgical intervention .  The patient's history has been reviewed, patient examined, no change in status, stable for surgery.  I have reviewed the patient's chart and labs.  Questions were answered to the patient's satisfaction.     Collier Bohnet

## 2016-11-07 NOTE — Op Note (Signed)
Preoperative diagnosis:acute cholecystitis Postoperative diagnosis: saa Procedure: Laparoscopic cholecystectomy Surgeon: Dr. Harden MoMatt [name] Anesthesia: Gen. Specimens: gb to pathology Estimated blood loss: minimal Complications: None Drains: none Sponge count was correct at completion Disposition to recovery stable  Indications: This is a [age] yof with acute cholecystitis. We discussed lap chole.  Procedure: After informed consent was obtained the patient was taken to the operating room. She was given antibiotics. SCDs were in place. She was placed undergeneral anesthesia without complication. Herabdomen was prepped and draped in the standard sterile surgical fashion. A surgical timeout was then performed.  I infiltrated marcaine in the luq after the stomach was evacuated. I then one second attempt with longer trocar was able to insert a 5 mm optiview trocar into the peritoneum and the abdomen was insufflated.  There was no entry injury. I then infiltrated Marcaine below the umbilicus. I made a vertical incision and inserted a 12 mm trocar. I then placed a 5 mm trocar in theepigastrium.Two5 mm trocars were placed in the right side of the abdomen. I grasped the gallbladder and retracted cephalad. The gallbladder had evidence of acute cholecystitis.I was able to identify the critical view of safety. I identified the cystic artery. I divided this with two clips remaining in place. I proceeded to address the cystic duct. I placed three clips and divided this. The cystic duct was viable and the clips completely traversed the duct. Two clips were left in place. I then removed the gallbladder from the liver bedI placed it in a bag and removed from the umbilicus. I obtained hemostasis. I placed a piece of surgicel snow in the liver bed.  I then removed the 12 mm trocar.  I closed this trocar site with 0 vicryl sutures using the endoclose device. I then removed the remaining trocars and these  were closed with 4-0 Monocryl and glue. She tolerated this well be transferred recovery room

## 2016-11-07 NOTE — Transfer of Care (Signed)
Immediate Anesthesia Transfer of Care Note  Patient: Sheryl Mason  Procedure(s) Performed: Procedure(s): LAPAROSCOPIC CHOLECYSTECTOMY (N/A)  Patient Location: PACU  Anesthesia Type:General  Level of Consciousness: awake, alert , oriented and patient cooperative  Airway & Oxygen Therapy: Patient Spontanous Breathing and Patient connected to nasal cannula oxygen  Post-op Assessment: Report given to RN and Post -op Vital signs reviewed and stable  Post vital signs: Reviewed and stable  Last Vitals:  Vitals:   11/07/16 0249 11/07/16 0553  BP: (!) 98/42 119/60  Pulse: 64 74  Resp: 17 18  Temp: 36.8 C 36.7 C    Last Pain:  Vitals:   11/07/16 0553  TempSrc: Oral  PainSc:          Complications: No apparent anesthesia complications

## 2016-11-07 NOTE — Anesthesia Postprocedure Evaluation (Signed)
Anesthesia Post Note  Patient: Sheryl Mason  Procedure(s) Performed: Procedure(s) (LRB): LAPAROSCOPIC CHOLECYSTECTOMY (N/A)     Patient location during evaluation: PACU Anesthesia Type: General Level of consciousness: awake and alert Pain management: pain level controlled Vital Signs Assessment: post-procedure vital signs reviewed and stable Respiratory status: spontaneous breathing, nonlabored ventilation, respiratory function stable and patient connected to nasal cannula oxygen Cardiovascular status: blood pressure returned to baseline and stable Postop Assessment: no signs of nausea or vomiting Anesthetic complications: no    Last Vitals:  Vitals:   11/07/16 0553 11/07/16 0915  BP: 119/60   Pulse: 74   Resp: 18   Temp: 36.7 C 36.5 C    Last Pain:  Vitals:   11/07/16 0915  TempSrc:   PainSc: 10-Worst pain ever                 Ting Cage S

## 2016-11-07 NOTE — Progress Notes (Signed)
Spoke with Dr. Okey Dupreose re: need for preg test.  MD okay with not obtaining Hcg Istat prior to surgery since pt has Mirena IUD.

## 2016-11-08 ENCOUNTER — Encounter (HOSPITAL_COMMUNITY): Payer: Self-pay | Admitting: General Surgery

## 2016-11-08 MED ORDER — OXYCODONE HCL 5 MG PO TABS: 5.0000 mg | ORAL_TABLET | Freq: Four times a day (QID) | ORAL | 0 refills | Status: DC | PRN

## 2016-11-08 MED ORDER — OXYCODONE HCL 5 MG PO TABS: 5.0000 mg | ORAL_TABLET | ORAL | Status: DC | PRN

## 2016-11-08 MED ORDER — MORPHINE SULFATE (PF) 4 MG/ML IV SOLN: 2.0000 mg | INTRAVENOUS | Status: DC | PRN

## 2016-11-08 NOTE — Discharge Instructions (Signed)
Please arrive at least 30 min before your appointment to complete your check in paperwork.  If you are unable to arrive 30 min prior to your appointment time we may have to cancel or reschedule you.  Surgeon: Emelia LoronMatthew Wakefield MD  LAPAROSCOPIC SURGERY: POST OP INSTRUCTIONS  1. DIET: Follow a light bland diet the first 24 hours after arrival home, such as soup, liquids, crackers, etc. Be sure to include lots of fluids daily. Avoid fast food or heavy meals as your are more likely to get nauseated. Eat a low fat the next few days after surgery.  2. Take your usually prescribed home medications unless otherwise directed. 3. PAIN CONTROL:  1. Pain is best controlled by a usual combination of three different methods TOGETHER:  1. Ice/Heat 2. Over the counter pain medication 3. Prescription pain medication 2. Most patients will experience some swelling and bruising around the incisions. Ice packs or heating pads (30-60 minutes up to 6 times a day) will help. Use ice for the first few days to help decrease swelling and bruising, then switch to heat to help relax tight/sore spots and speed recovery. Some people prefer to use ice alone, heat alone, alternating between ice & heat. Experiment to what works for you. Swelling and bruising can take several weeks to resolve.  3. It is helpful to take an over-the-counter pain medication regularly for the first few weeks. Choose one of the following that works best for you:  1. Naproxen (Aleve, etc) Two 220mg  tabs twice a day 2. Ibuprofen (Advil, etc) Three 200mg  tabs four times a day (every meal & bedtime) 3. Acetaminophen (Tylenol, etc) 500-650mg  four times a day (every meal & bedtime) 4. A prescription for pain medication (such as oxycodone, hydrocodone, etc) should be given to you upon discharge. Take your pain medication as prescribed.  1. If you are having problems/concerns with the prescription medicine (does not control pain, nausea, vomiting, rash, itching,  etc), please call us [phone] to see if we need to switch you to a different pain medicine that will work better for you and/or control your side effect better. 2. If you need a refill on your pain medication, please contact your pharmacy. They will contact our office to request authorization. Prescriptions will not be filled after 5 pm or on week-ends. 4. Avoid getting constipated. Between the surgery and the pain medications, it is common to experience some constipation. Increasing fluid intake and taking a fiber supplement (such as Metamucil, Citrucel, FiberCon, MiraLax, etc) 1-2 times a day regularly will usually help prevent this problem from occurring. A mild laxative (prune juice, Milk of Magnesia, MiraLax, etc) should be taken according to package directions if there are no bowel movements after 48 hours.  5. Watch out for diarrhea. If you have many loose bowel movements, simplify your diet to bland foods & liquids for a few days. Stop any stool softeners and decrease your fiber supplement. Switching to mild anti-diarrheal medications (Kayopectate, Pepto Bismol) can help. If this worsens or does not improve, please call us. 6. Wash / shower every day. You may shower over the dressings as they are waterproof. Continue to shower over incision(s) after the dressing is off. If there is glue over the incisions try not to pick it off, let it fall off naturally. 7. Remove your waterproof bandages 2 days after surgery. You may leave the incision open to air. You may replace a dressing/Band-Aid to cover the incision for comfort if you wish.  8. ACTIVITIES as tolerated:  1. You may resume regular (light) daily activities beginning the next day--such as daily self-care, walking, climbing stairs--gradually increasing activities as tolerated. If you can walk 30 minutes without difficulty, it is safe to try more intense activity such as jogging, treadmill, bicycling, low-impact aerobics, swimming,  etc. 2. Save the most intensive and strenuous activity for last such as sit-ups, heavy lifting, contact sports, etc Refrain from any heavy lifting or straining until you are off narcotics for pain control. For the first 2-3 weeks do not lift over 10-15lb.  3. DO NOT PUSH THROUGH PAIN. Let pain be your guide: If it hurts to do something, don't do it. Pain is your body warning you to avoid that activity for another week until the pain goes down. 4. You may drive when you are no longer taking prescription pain medication, you can comfortably wear a seatbelt, and you can safely maneuver your car and apply brakes. 5. You may have sexual intercourse when it is comfortable.  9. FOLLOW UP in our office  1. Please call [facility] at [phone] to set up an appointment to see your surgeon in the office for a follow-up appointment approximately 2-3 weeks after your surgery. 2. Make sure that you call for this appointment the day you arrive home to insure a convenient appointment time.      10. IF YOU HAVE DISABILITY OR FAMILY LEAVE FORMS, BRING THEM TO THE               OFFICE FOR PROCESSING.   WHEN TO CALL us [phone]:  1. Poor pain control 2. Reactions / problems with new medications (rash/itching, nausea, etc)  3. Fever over 101.5 F (38.5 C) 4. Inability to urinate 5. Nausea and/or vomiting 6. Worsening swelling or bruising 7. Continued bleeding from incision. 8. Increased pain, redness, or drainage from the incision  The clinic staff is available to answer your questions during regular business hours (8:30am-5pm). Please dont hesitate to call and ask to speak to one of our nurses for clinical concerns.  If you have a medical emergency, go to the nearest emergency room or call 911.  A surgeon from [facility] is always on call at the [facility], [place], [place], [place], [place] [address] ?  MAIN: [phone] ? TOLL FREE:  [phone] ?  FAX [phone]  [email or web address]

## 2016-11-08 NOTE — Progress Notes (Signed)
Discharge instructions gone over with patient and spouse. Home medications discussed. Prescription given. Follow up appointment to be made. Diet, incisional care, and reasons to call the doctor gone over. Bowel regimen discussed. Signs and symptoms of infection discussed. Work notes given. Patient verbalized understanding of instructions.

## 2016-11-08 NOTE — Discharge Summary (Signed)
  [facility] Dis[facility]   Patient ID: Sheryl PoeCrystal Annette Mason MRN: [id number] DOB/AGE: July 02, 1980 36 y.o.  Admit date: 11/06/2016 Discharge date: 11/08/2016  Admitting Diagnosis: Symptomatic cholelithiasis  Discharge Diagnosis Patient Active Problem List   Diagnosis Date Noted  . Acute calculous cholecystitis 11/06/2016    Consultants None  Imaging: No results found.  Procedures Dr. Dwain SarnaWakefield (11/07/16) - Laparoscopic Cholecystectomy  Hospital Course:  Sheryl Mason is a 36yo female who [facility] with year long history of intermittent biliary colic, with 2 weeks of gradually worsening RUQ pain, n/v.  She had an outpatient u/s earlier in the day that showed cholelithiasis with possible gallbladder wall thickening and pericholecystic fluid. She also had a leukocytosis WBC 13.  Patient was admitted and underwent procedure listed above.  Tolerated procedure well and was transferred to the floor.  Diet was advanced as tolerated.  On POD1 the patient was voiding well, tolerating diet, ambulating well, pain well controlled, vital signs stable, incisions c/d/i and felt stable for discharge home.  Patient will follow up in our office in 2 weeks and knows to call with questions or concerns.  She will call to confirm appointment date/time.    I have personally reviewed the patients medication history on the [place] controlled substance database.    Allergies as of 11/08/2016   No Known Allergies     Medication List    TAKE these medications   acetaminophen 500 MG tablet Commonly known as:  TYLENOL Take 500 mg by mouth every 6 (six) hours as needed for mild pain.   levonorgestrel 20 MCG/24HR IUD Commonly known as:  MIRENA 1 each by Intrauterine route once.   naproxen 500 MG tablet Commonly known as:  NAPROSYN Take 1 tablet (500 mg total) by mouth 2 (two) times daily.   omeprazole 20 MG capsule Commonly known as:  PRILOSEC Take 20 mg by  mouth daily.   ondansetron 8 MG tablet Commonly known as:  ZOFRAN Take 8 mg by mouth every 8 (eight) hours as needed for nausea.   oxyCODONE 5 MG immediate release tablet Commonly known as:  Oxy IR/ROXICODONE Take 1-2 tablets (5-10 mg total) by mouth every 6 (six) hours as needed for moderate pain or severe pain.        Follow-up Information    [facility] [place] in 2 week(s).   Specialty:  General Surgery Why:  We are working on your appointment, please call to confirm. Please arrive at least 30 min before your appointment to complete your check in paperwork. Contact information: [address] [place] [id number] [phone]          Signed: Franne FortsBROOKE A Boyd [place], [facility] 11/08/2016, 10:03 AM Pager: [phone] Consults: [phone] Mon-Fri 7:00 am-4:30 pm Sat-Sun 7:00 am-11:30 am

## 2016-11-08 NOTE — Progress Notes (Signed)
Patient ID: Sheryl Mason, female   DOB: January 24, 1981, 36 y.o.   MRN: [id number] 1 Day Post-Op   Subjective: Doing well. Tolerating liquids. Incisions sorbic preoperative pain is relieved.  Objective: Vital signs in last 24 hours: Temp:  [97 F (36.1 C)-98.6 F (37 C)] 98.1 F (36.7 C) (08/05 0500) Pulse Rate:  [52-117] 81 (08/05 0500) Resp:  [10-19] 19 (08/05 0500) BP: (125-145)/(58-86) 125/58 (08/05 0500) SpO2:  [95 %-100 %] 100 % (08/05 0500) Last BM Date: 11/06/16  Intake/Output from previous day: 08/04 0701 - 08/05 0700 In: 3024.8 [P.O.:834; I.V.:2140.8; IV Piggyback:50] Out: 30 [Blood:30] Intake/Output this shift: No intake/output data recorded.  General appearance: alert, cooperative and no distress GI: normal findings: soft, non-tender Incision/Wound: No erythema or drainage  Lab Results:   Recent Labs  11/06/16 1207  WBC 13.0*  HGB 13.0  HCT 40.1  PLT 228   BMET  Recent Labs  11/06/16 1207  NA 138  K 4.3  CL 106  CO2 26  GLUCOSE 95  BUN 10  CREATININE 0.76  CALCIUM 9.5     Studies/Results: No results found.  Anti-infectives: Anti-infectives    Start     Dose/Rate Route Frequency Ordered Stop   11/06/16 1600  cefTRIAXone (ROCEPHIN) 2 g in dextrose 5 % 50 mL IVPB     2 g 100 mL/hr over 30 Minutes Intravenous Every 24 hours 11/06/16 1527        Assessment/Plan: s/p Procedure(s): LAPAROSCOPIC CHOLECYSTECTOMY Doing well postoperatively without complication. Okay for discharge today.   LOS: 0 days    Fayez Sturgell T 11/08/2016

## 2016-11-10 LAB — HIV ANTIBODY (ROUTINE TESTING W REFLEX): HIV Screen 4th Generation wRfx: NONREACTIVE

## 2018-02-11 ENCOUNTER — Emergency Department (HOSPITAL_COMMUNITY)
Admission: EM | Admit: 2018-02-11 | Discharge: 2018-02-11 | Disposition: A | Discharge status: Home / Self Care | Payer: Managed Care, Other (non HMO) | Attending: Emergency Medicine | Admitting: Emergency Medicine

## 2018-02-11 ENCOUNTER — Encounter (HOSPITAL_COMMUNITY): Payer: Self-pay

## 2018-02-11 ENCOUNTER — Other Ambulatory Visit: Payer: Self-pay

## 2018-02-11 ENCOUNTER — Emergency Department (HOSPITAL_COMMUNITY): Payer: Managed Care, Other (non HMO)

## 2018-02-11 DIAGNOSIS — Z3202 Encounter for pregnancy test, result negative: Secondary | ICD-10-CM | POA: Insufficient documentation

## 2018-02-11 DIAGNOSIS — R109 Unspecified abdominal pain: Secondary | ICD-10-CM | POA: Diagnosis present

## 2018-02-11 DIAGNOSIS — I7300 Raynaud's syndrome without gangrene: Secondary | ICD-10-CM | POA: Diagnosis not present

## 2018-02-11 DIAGNOSIS — N200 Calculus of kidney: Secondary | ICD-10-CM

## 2018-02-11 HISTORY — DX: Gastro-esophageal reflux disease without esophagitis: K21.9

## 2018-02-11 LAB — COMPREHENSIVE METABOLIC PANEL
ALT: 22 U/L (ref 0–44)
AST: 14 U/L — AB (ref 15–41)
Albumin: 3.5 g/dL (ref 3.5–5.0)
Alkaline Phosphatase: 50 U/L (ref 38–126)
Anion gap: 7 (ref 5–15)
BUN: 17 mg/dL (ref 6–20)
CHLORIDE: 105 mmol/L (ref 98–111)
CO2: 24 mmol/L (ref 22–32)
Calcium: 9 mg/dL (ref 8.9–10.3)
Creatinine, Ser: 0.86 mg/dL (ref 0.44–1.00)
GFR calc Af Amer: >60 mL/min (ref >60)
Glucose, Bld: 104 mg/dL — ABNORMAL HIGH (ref 70–99)
POTASSIUM: 3.2 mmol/L — AB (ref 3.5–5.1)
SODIUM: 136 mmol/L (ref 135–145)
Total Bilirubin: 0.6 mg/dL (ref 0.3–1.2)
Total Protein: 6.8 g/dL (ref 6.5–8.1)

## 2018-02-11 LAB — CBC WITH DIFFERENTIAL/PLATELET
ABS IMMATURE GRANULOCYTES: 0.14 10*3/uL — AB (ref 0.00–0.07)
BASOS ABS: 0.1 10*3/uL (ref 0.0–0.1)
BASOS PCT: 1 %
EOS ABS: 0.2 10*3/uL (ref 0.0–0.5)
Eosinophils Relative: 1 %
HCT: 39.8 % (ref 36.0–46.0)
Hemoglobin: 12.5 g/dL (ref 12.0–15.0)
IMMATURE GRANULOCYTES: 1 %
Lymphocytes Relative: 40 %
Lymphs Abs: 5.3 10*3/uL — ABNORMAL HIGH (ref 0.7–4.0)
MCH: 28.4 pg (ref 26.0–34.0)
MCHC: 31.4 g/dL (ref 30.0–36.0)
MCV: 90.5 fL (ref 80.0–100.0)
Monocytes Absolute: 1.3 10*3/uL — ABNORMAL HIGH (ref 0.1–1.0)
Monocytes Relative: 9 %
NEUTROS ABS: 6.5 10*3/uL (ref 1.7–7.7)
NEUTROS PCT: 48 %
NRBC: 0 % (ref 0.0–0.2)
PLATELETS: 287 10*3/uL (ref 150–400)
RBC: 4.4 MIL/uL (ref 3.87–5.11)
RDW: 13.1 % (ref 11.5–15.5)
WBC: 13.4 10*3/uL — ABNORMAL HIGH (ref 4.0–10.5)

## 2018-02-11 LAB — URINALYSIS, ROUTINE W REFLEX MICROSCOPIC
BILIRUBIN URINE: NEGATIVE
GLUCOSE, UA: NEGATIVE mg/dL
KETONES UR: NEGATIVE mg/dL
LEUKOCYTES UA: NEGATIVE
Nitrite: NEGATIVE
PH: 5 (ref 5.0–8.0)
Protein, ur: 100 mg/dL — AB
Specific Gravity, Urine: 1.034 — ABNORMAL HIGH (ref 1.005–1.030)

## 2018-02-11 LAB — LIPASE, BLOOD: LIPASE: 22 U/L (ref 11–51)

## 2018-02-11 LAB — PREGNANCY, URINE: Preg Test, Ur: NEGATIVE

## 2018-02-11 MED ORDER — TAMSULOSIN HCL 0.4 MG PO CAPS
0.4000 mg | ORAL_CAPSULE | Freq: Once | ORAL | Status: AC
  Administered 2018-02-11: 0.4 mg via ORAL
  Filled 2018-02-11: qty 1

## 2018-02-11 MED ORDER — TAMSULOSIN HCL 0.4 MG PO CAPS: 0.4000 mg | ORAL_CAPSULE | Freq: Every day | ORAL | 0 refills | Status: AC

## 2018-02-11 MED ORDER — ONDANSETRON HCL 4 MG/2ML IJ SOLN
4.0000 mg | Freq: Once | INTRAMUSCULAR | Status: AC
  Administered 2018-02-11: 4 mg via INTRAVENOUS
  Filled 2018-02-11: qty 2

## 2018-02-11 MED ORDER — FENTANYL CITRATE (PF) 100 MCG/2ML IJ SOLN
50.0000 ug | Freq: Once | INTRAMUSCULAR | Status: AC
  Administered 2018-02-11: 50 ug via INTRAVENOUS
  Filled 2018-02-11: qty 2

## 2018-02-11 MED ORDER — KETOROLAC TROMETHAMINE 30 MG/ML IJ SOLN
30.0000 mg | Freq: Once | INTRAMUSCULAR | Status: AC
  Administered 2018-02-11: 30 mg via INTRAVENOUS
  Filled 2018-02-11: qty 1

## 2018-02-11 MED ORDER — ONDANSETRON HCL 4 MG PO TABS: 4.0000 mg | ORAL_TABLET | Freq: Three times a day (TID) | ORAL | 0 refills | Status: AC | PRN

## 2018-02-11 MED ORDER — LACTATED RINGERS IV BOLUS
1000.0000 mL | Freq: Once | INTRAVENOUS | Status: AC
  Administered 2018-02-11: 1000 mL via INTRAVENOUS

## 2018-02-11 MED ORDER — OXYCODONE-ACETAMINOPHEN 5-325 MG PO TABS
2.0000 | ORAL_TABLET | Freq: Once | ORAL | Status: AC
  Administered 2018-02-11: 2 via ORAL
  Filled 2018-02-11: qty 2

## 2018-02-11 MED ORDER — HYDROMORPHONE HCL 1 MG/ML IJ SOLN
1.0000 mg | Freq: Once | INTRAMUSCULAR | Status: AC
  Administered 2018-02-11: 1 mg via INTRAVENOUS
  Filled 2018-02-11: qty 1

## 2018-02-11 MED ORDER — OXYCODONE HCL 5 MG PO TABS: 5.0000 mg | ORAL_TABLET | ORAL | 0 refills | Status: AC | PRN

## 2018-02-11 MED ORDER — ACETAMINOPHEN 500 MG PO TABS: 500.0000 mg | ORAL_TABLET | Freq: Four times a day (QID) | ORAL | 0 refills | Status: AC | PRN

## 2018-02-11 NOTE — ED Provider Notes (Signed)
Emergency Department Provider Note   I have reviewed the triage vital signs and the nursing notes.   HISTORY  Chief Complaint Flank Pain   HPI Sheryl Mason is a 37 y.o. female without significant past medical history the presents to the emergency department today secondary to acute onset of left pelvic pain that radiates up to her left flank.  It started just in the pelvis but then shortly afterwards started with her flank.  She states that she does not have any history of kidney stones that she knows of.  She states that she was recently started on amoxicillin and prednisone for strep throat and still on those medicines.  She also states that she has some discolored urine and she is treated for yeast infection last week but no other urinary symptoms.  No blood in her urine that she is noticed.  No vaginal discharge.  No nausea vomiting or fevers.  Naproxen which did not seem to help much so presents here for further evaluation.  Has Mirena.  No history of autoimmune diseases or kidney problems in the past.    Of note she states that her fingers will change colors sometimes will have tingling in the tips and this is been going on for a while.  She also states that she had a venous stasis ulcer on her left leg which caused some circulation problems in her left leg and is more swollen than the right but this is her baseline is been this way for more than a year. No other associated or modifying symptoms.    Past Medical History:  Diagnosis Date  . Acid reflux   . GERD (gastroesophageal reflux disease)   . Obesity   . Vertigo     Patient Active Problem List   Diagnosis Date Noted  . Acute calculous cholecystitis 11/06/2016    Past Surgical History:  Procedure Laterality Date  . CHOLECYSTECTOMY N/A 11/07/2016   Procedure: LAPAROSCOPIC CHOLECYSTECTOMY;  Surgeon: Emelia Loron, MD;  Location: [facility] OR;  Service: General;  Laterality: N/A;    Current Outpatient Rx  .  Order #: [id number] Class: Historical Med  . Order #: [id number] Class: Historical Med  . Order #: [id number] Class: Print  . Order #: [id number] Class: Print  . Order #: [id number] Class: Print  . Order #: [id number] Class: Print    Allergies Patient has no known allergies.  No family history on file.  Social History Social History   Tobacco Use  . Smoking status: Never Smoker  . Smokeless tobacco: Never Used  Substance Use Topics  . Alcohol use: No    Comment: occasional  . Drug use: No    Review of Systems  All other systems negative except as documented in the HPI. All pertinent positives and negatives as reviewed in the HPI. ____________________________________________   PHYSICAL EXAM:  VITAL SIGNS: ED Triage Vitals  Enc Vitals Group     BP 02/11/18 0748 114/70     Pulse Rate 02/11/18 0748 71     Resp 02/11/18 0748 18     Temp 02/11/18 0748 98.9 F (37.2 C)     Temp Source 02/11/18 0748 Oral     SpO2 02/11/18 0748 100 %     Weight 02/11/18 0750 278 lb (126.1 kg)     Height 02/11/18 0750 5\' 6"  (1.676 m)     Head Circumference --      Peak Flow --      Pain Score 02/11/18 0745 9     Pain  Loc --      Pain Edu? --      Excl. in GC? --     Constitutional: Alert and oriented. Well appearing and in no acute distress. Eyes: Conjunctivae are normal. PERRL. EOMI. Head: Atraumatic. Nose: No congestion/rhinnorhea. Mouth/Throat: Mucous membranes are moist.  Oropharynx non-erythematous. Neck: No stridor.  No meningeal signs.   Cardiovascular: Normal rate, regular rhythm. slightly cyanotic appearing fingers. Grossly normal heart sounds.   Respiratory: Normal respiratory effort.  No retractions. Lungs CTAB. Gastrointestinal: Soft and nontender. No distention.  Musculoskeletal: No lower extremity tenderness nor edema. No gross deformities of extremities. Neurologic:  Normal speech and language. No gross focal neurologic deficits are appreciated.  Skin:  Skin is warm, dry and  intact. No rash noted.   ____________________________________________   LABS (all labs ordered are listed, but only abnormal results are displayed)  Labs Reviewed  CBC WITH DIFFERENTIAL/PLATELET - Abnormal; Notable for the following components:      Result Value   WBC 13.4 (*)    Lymphs Abs 5.3 (*)    Monocytes Absolute 1.3 (*)    Abs Immature Granulocytes 0.14 (*)    All other components within normal limits  COMPREHENSIVE METABOLIC PANEL - Abnormal; Notable for the following components:   Potassium 3.2 (*)    Glucose, Bld 104 (*)    AST 14 (*)    All other components within normal limits  URINALYSIS, ROUTINE W REFLEX MICROSCOPIC - Abnormal; Notable for the following components:   Color, Urine AMBER (*)    APPearance CLOUDY (*)    Specific Gravity, Urine 1.034 (*)    Hgb urine dipstick LARGE (*)    Protein, ur 100 (*)    RBC / HPF >50 (*)    Bacteria, UA FEW (*)    All other components within normal limits  URINE CULTURE  LIPASE, BLOOD  PREGNANCY, URINE   ____________________________________________  RADIOLOGY  Ct Renal Stone Study  Result Date: 02/11/2018 CLINICAL DATA:  Left flank pain EXAM: CT ABDOMEN AND PELVIS WITHOUT CONTRAST TECHNIQUE: Multidetector CT imaging of the abdomen and pelvis was performed following the standard protocol without IV contrast. COMPARISON:  None. FINDINGS: Lower chest: Mild airspace disease in the lingula and right middle lobe which could be pneumonia or scarring. No pleural effusion. Cardiac enlargement. Hepatobiliary: Postop cholecystectomy without biliary dilatation. No focal liver lesion on unenhanced imaging Pancreas: Negative Spleen: Negative Adrenals/Urinary Tract: Elongated calcification in the region the distal left ureter most consistent with ureteral calculus measuring approximately 3 x 7 mm. Mild left hydronephrosis. Normal right kidney. No other renal calculi. Stomach/Bowel: Appendix not definitely seen. Negative for acute  appendicitis. Negative for bowel obstruction. No bowel mass or edema. Vascular/Lymphatic: No significant vascular findings are present. No enlarged abdominal or pelvic lymph nodes. Reproductive: IUD in satisfactory position. Uterus and adnexa normal Other: No free fluid Musculoskeletal: Negative IMPRESSION: 3 x 7 mm calcification in the region of the distal left ureter most compatible with partially obstructing distal ureteral calculus on the left. No other acute abnormality.  Appendix not visualized. Electronically Signed   By: Marlan Palau M.D.   On: 02/11/2018 13:06    ____________________________________________   PROCEDURES  Procedure(s) performed:   Procedures   ____________________________________________   INITIAL IMPRESSION / ASSESSMENT AND PLAN / ED COURSE  I think the patient likely has a kidney stone however with abnormal colored urine for the last week will wait for her urinalysis to evaluate for possible cystitis and pyelonephritis before ordering  a CT scan so I ordered the appropriate one.  Consider diverticulitis but no other GI symptoms.  Considered pelvic inflammatory disease but no vaginal symptoms and is only one-sided.  Also consider glomerulonephritis secondary to the recent strep infection however we will see what her kidney function looks like urinalysis looks like before travel to far down that route.  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  It comes and goes is not an issue at this time I will give her a handout letter follow-up with her doctor.  Also found to have a L sided kidney stone as cause for her symptoms. Tolerating pain on the oral medications. Discussed with Dr. Ronne Binning ith urology and after review of labs/imaging, is ok with discharge and outpatient follow up.      Pertinent labs & imaging results that were available during my care of the patient were reviewed by me and considered in my medical decision making (see chart for  details).  ____________________________________________  FINAL CLINICAL IMPRESSION(S) / ED DIAGNOSES  Final diagnoses:  Raynaud's phenomenon without gangrene  Nephrolithiasis     MEDICATIONS GIVEN DURING THIS VISIT:  Medications  lactated ringers bolus 1,000 mL (0 mLs Intravenous Stopped 02/11/18 0929)  fentaNYL (SUBLIMAZE) injection 50 mcg (50 mcg Intravenous Given 02/11/18 0812)  ondansetron (ZOFRAN) injection 4 mg (4 mg Intravenous Given 02/11/18 0810)  HYDROmorphone (DILAUDID) injection 1 mg (1 mg Intravenous Given 02/11/18 1005)  HYDROmorphone (DILAUDID) injection 1 mg (1 mg Intravenous Given 02/11/18 1227)  ketorolac (TORADOL) 30 MG/ML injection 30 mg (30 mg Intravenous Given 02/11/18 1337)  tamsulosin (FLOMAX) capsule 0.4 mg (0.4 mg Oral Given 02/11/18 1337)  oxyCODONE-acetaminophen (PERCOCET/ROXICET) 5-325 MG per tablet 2 tablet (2 tablets Oral Given 02/11/18 1450)     NEW OUTPATIENT MEDICATIONS STARTED DURING THIS VISIT:  Discharge Medication List as of 02/11/2018  3:04 PM    START taking these medications   Details  acetaminophen (TYLENOL) 500 MG tablet Take 1 tablet (500 mg total) by mouth every 6 (six) hours as needed., Starting Fri 02/11/2018, Print    tamsulosin (FLOMAX) 0.4 MG CAPS capsule Take 1 capsule (0.4 mg total) by mouth daily., Starting Fri 02/11/2018, Print        Note:  This note was prepared with assistance of Dragon voice recognition software. Occasional wrong-word or sound-a-like substitutions may have occurred due to the inherent limitations of voice recognition software.   Marily Memos, MD 02/12/18 [phone]

## 2018-02-11 NOTE — ED Triage Notes (Signed)
Pt states that she has been having left sided flank/ LLQ pain. Pt reports last BM this morning, recently treated for yeast infection, and recently treated for strep throat.

## 2018-02-11 NOTE — ED Notes (Signed)
Pt up to void many time4s stating this hurts

## 2018-02-12 LAB — URINE CULTURE: Culture: <10000 — AB

## 2019-01-04 IMAGING — CT CT RENAL STONE PROTOCOL
2 of 4 series · 17 of 46 positions shown, 19 images · non-contrast
Comparison: None.

CLINICAL DATA: Left flank pain

EXAM:
CT ABDOMEN AND PELVIS WITHOUT CONTRAST
TECHNIQUE: Multidetector CT imaging of the abdomen and pelvis was performed
following the standard protocol without IV contrast.

[Series 3: a/p w/o 5mm · axial · non-contrast · 0.77mm/px · z∈[+673,+1108]mm · 14 of 95 slices shown, 16 images]
[im 4/95  soft-tissue]
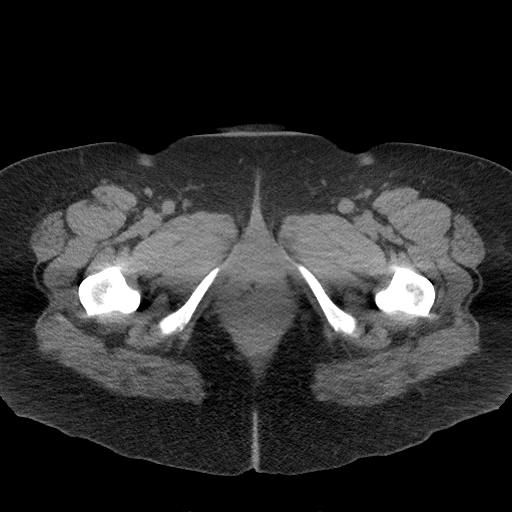
[im 4/95  bone]
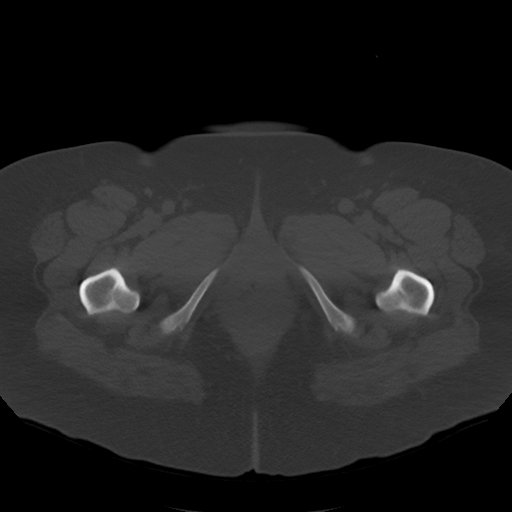
[im 12/95  soft-tissue]
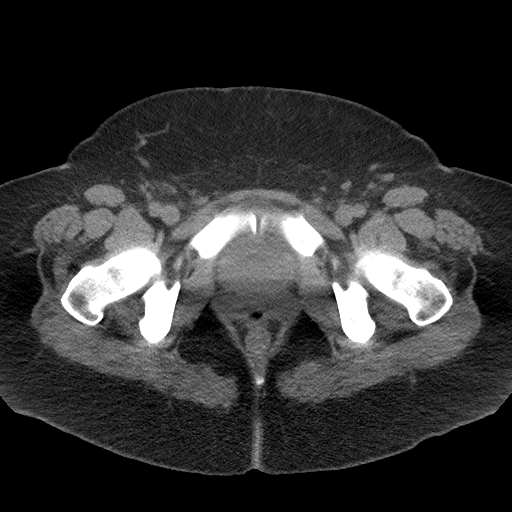
[im 20/95  soft-tissue]
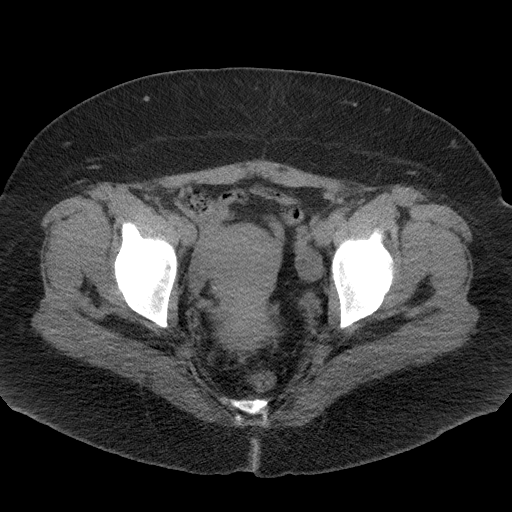
[im 24/95  soft-tissue]
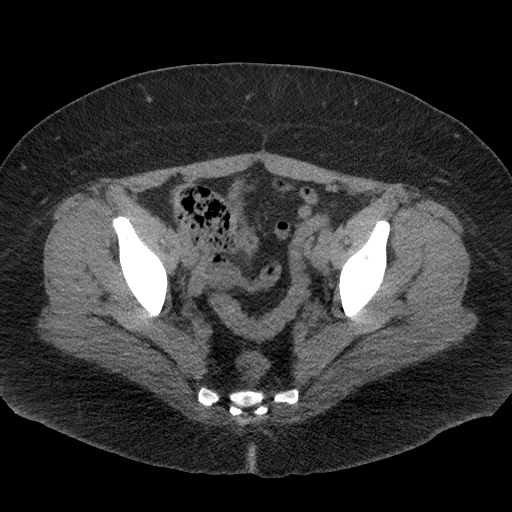
[im 32/95  soft-tissue]
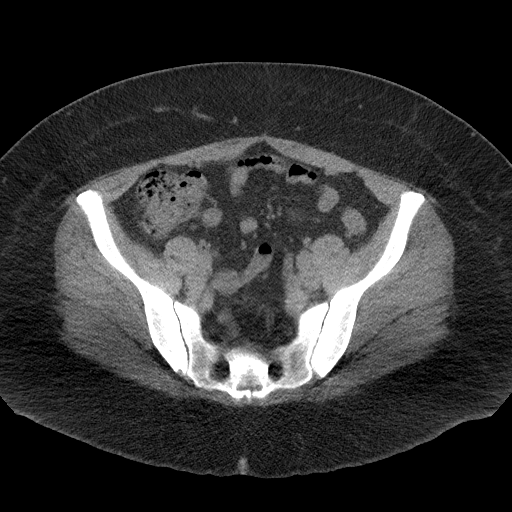
[im 40/95  soft-tissue]
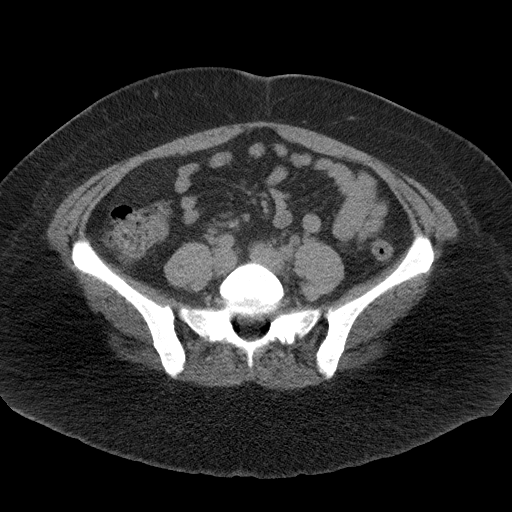
[im 44/95  soft-tissue]
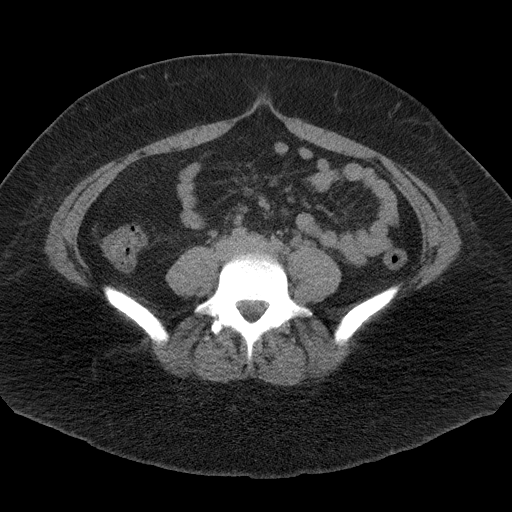
[im 51/95  soft-tissue]
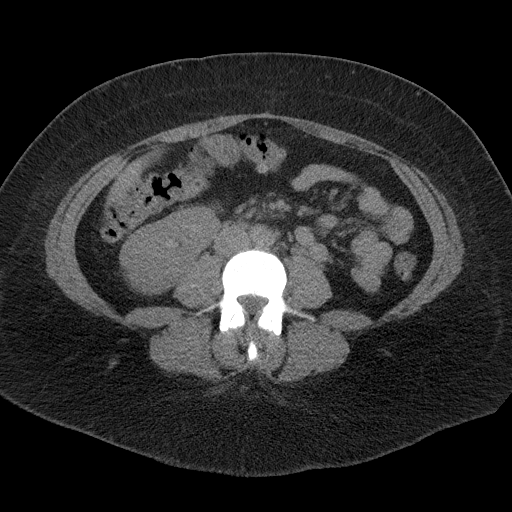
[im 55/95  soft-tissue]
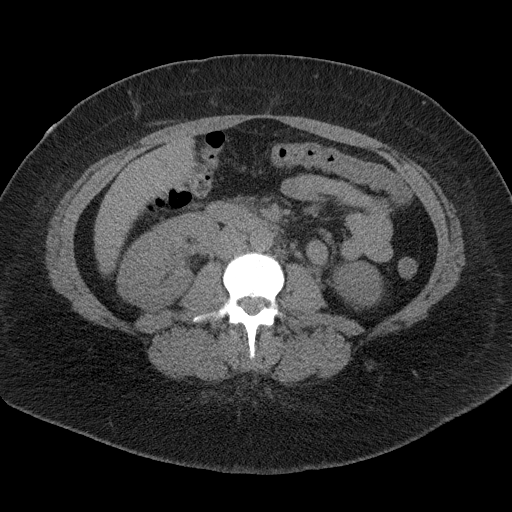
[im 55/95  bone]
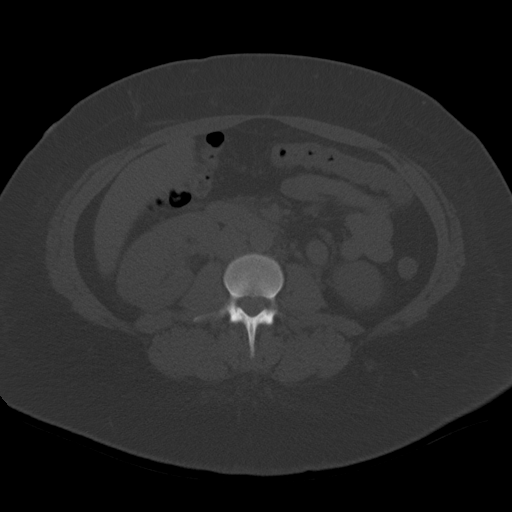
[im 63/95  soft-tissue]
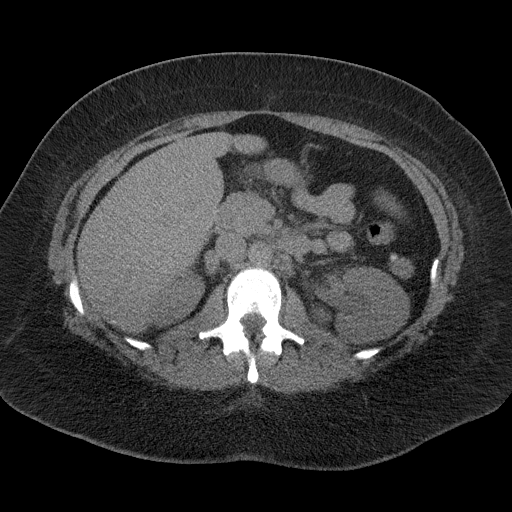
[im 71/95  soft-tissue]
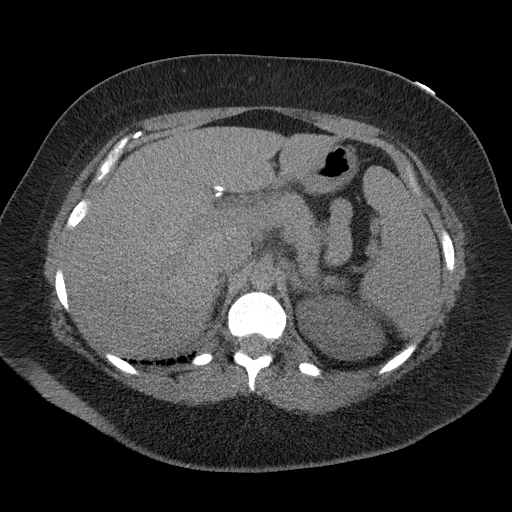
[im 75/95  soft-tissue]
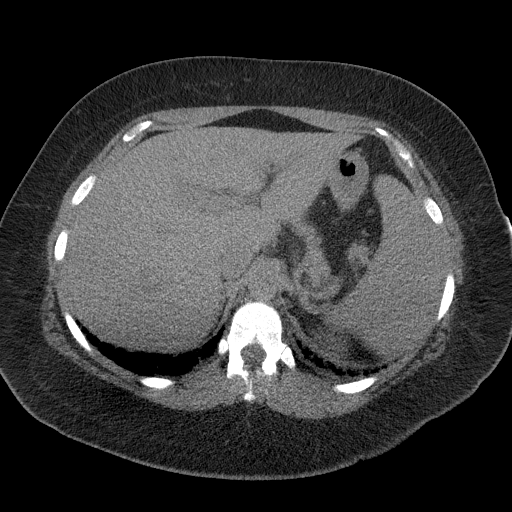
[im 83/95  soft-tissue]
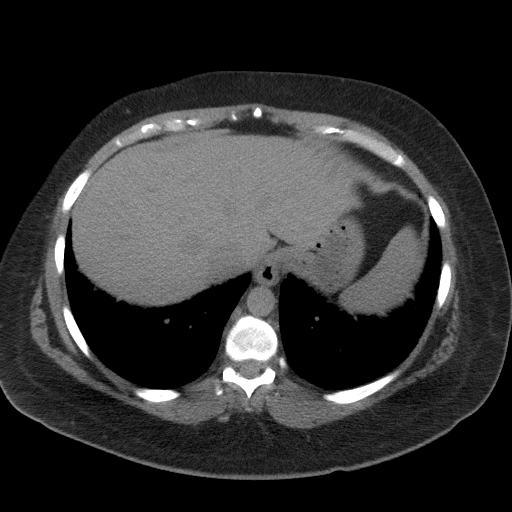
[im 91/95  soft-tissue]
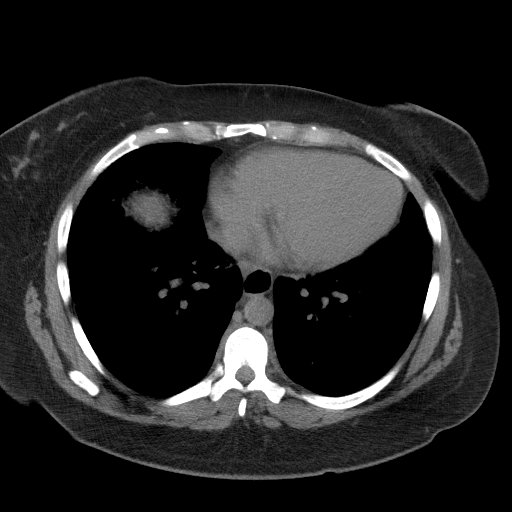

[Series 6: a/p w/o cor · coronal · non-contrast · 0.81mm/px · 3 of 170 slices shown]
[im 57/170  soft-tissue]
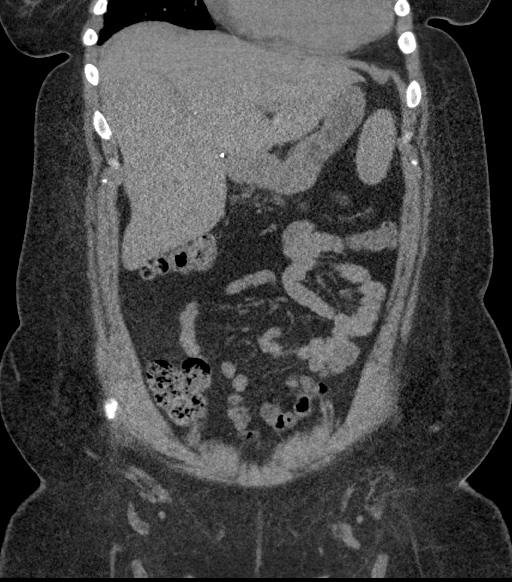
[im 76/170  soft-tissue]
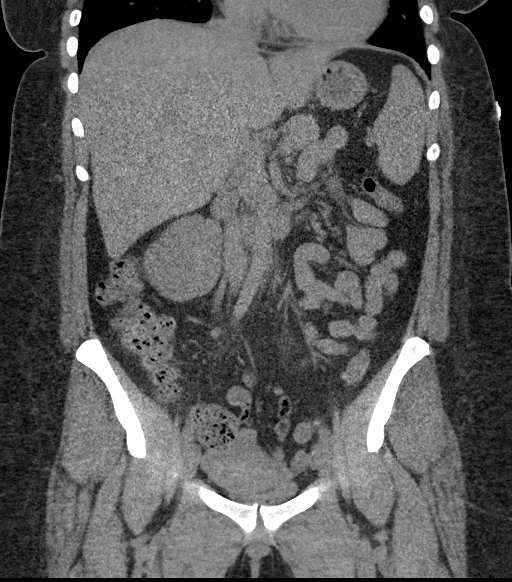
[im 94/170  soft-tissue]
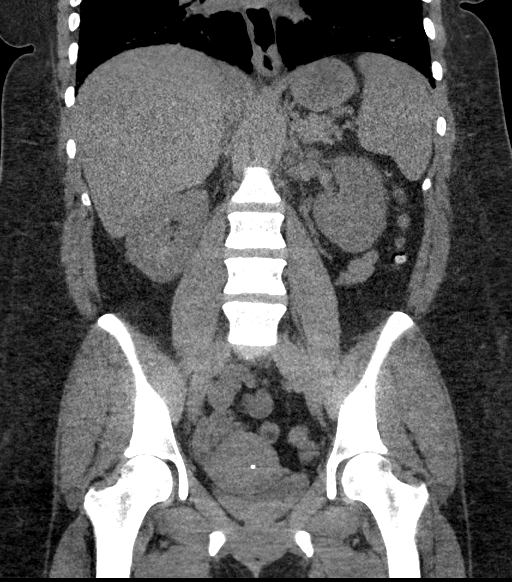

[17 of 46 positions shown; findings below may reference images not displayed]

FINDINGS: Lower chest: Mild airspace disease in the lingula and right middle
lobe which could be pneumonia or scarring. No pleural effusion.
Cardiac enlargement.

Hepatobiliary: Postop cholecystectomy without biliary dilatation. No
focal liver lesion on unenhanced imaging

Pancreas: Negative

Spleen: Negative

Adrenals/Urinary Tract: Elongated calcification in the region the
distal left ureter most consistent with ureteral calculus measuring
approximately 3 x 7 mm. Mild left hydronephrosis. Normal right
kidney. No other renal calculi.

Stomach/Bowel: Appendix not definitely seen. Negative for acute
appendicitis. Negative for bowel obstruction. No bowel mass or
edema.

Vascular/Lymphatic: No significant vascular findings are present. No
enlarged abdominal or pelvic lymph nodes.

Reproductive: IUD in satisfactory position. Uterus and adnexa normal

Other: No free fluid

Musculoskeletal: Negative
IMPRESSION: 3 x 7 mm calcification in the region of the distal left ureter most
compatible with partially obstructing distal ureteral calculus on
the left.

No other acute abnormality.  Appendix not visualized.
